# Patient Record
Sex: Female | Born: 1975
Health system: Southern US, Community
[De-identification: ages and names within clinical notes are randomized; demographics above are authoritative.]

## PROBLEM LIST (undated history)

## (undated) DIAGNOSIS — I10 Essential (primary) hypertension: Secondary | ICD-10-CM

## (undated) DIAGNOSIS — K5792 Diverticulitis of intestine, part unspecified, without perforation or abscess without bleeding: Secondary | ICD-10-CM

## (undated) DIAGNOSIS — J449 Chronic obstructive pulmonary disease, unspecified: Secondary | ICD-10-CM

## (undated) DIAGNOSIS — E119 Type 2 diabetes mellitus without complications: Secondary | ICD-10-CM

## (undated) DIAGNOSIS — I4891 Unspecified atrial fibrillation: Secondary | ICD-10-CM

## (undated) DIAGNOSIS — E669 Obesity, unspecified: Secondary | ICD-10-CM

## (undated) DIAGNOSIS — N390 Urinary tract infection, site not specified: Secondary | ICD-10-CM

## (undated) DIAGNOSIS — I509 Heart failure, unspecified: Secondary | ICD-10-CM

## (undated) HISTORY — PX: TUBAL LIGATION: SHX77

## (undated) HISTORY — PX: CARDIAC CATHETERIZATION: SHX172

## (undated) HISTORY — PX: ABDOMINAL HYSTERECTOMY: SHX81

---

## 1998-03-24 ENCOUNTER — Emergency Department (HOSPITAL_COMMUNITY): Admission: EM | Admit: 1998-03-24 | Discharge: 1998-03-24 | Payer: Self-pay | Admitting: Emergency Medicine

## 1998-04-28 ENCOUNTER — Emergency Department (HOSPITAL_COMMUNITY): Admission: EM | Admit: 1998-04-28 | Discharge: 1998-04-28 | Payer: Self-pay | Admitting: Emergency Medicine

## 1998-06-15 ENCOUNTER — Emergency Department (HOSPITAL_COMMUNITY): Admission: EM | Admit: 1998-06-15 | Discharge: 1998-06-15 | Payer: Self-pay

## 1999-07-02 ENCOUNTER — Emergency Department (HOSPITAL_COMMUNITY): Admission: EM | Admit: 1999-07-02 | Discharge: 1999-07-02 | Payer: Self-pay | Admitting: Emergency Medicine

## 1999-08-29 ENCOUNTER — Emergency Department (HOSPITAL_COMMUNITY): Admission: EM | Admit: 1999-08-29 | Discharge: 1999-08-29 | Payer: Self-pay | Admitting: Emergency Medicine

## 1999-10-13 ENCOUNTER — Emergency Department (HOSPITAL_COMMUNITY): Admission: EM | Admit: 1999-10-13 | Discharge: 1999-10-13 | Payer: Self-pay | Admitting: *Deleted

## 1999-10-27 ENCOUNTER — Emergency Department (HOSPITAL_COMMUNITY): Admission: EM | Admit: 1999-10-27 | Discharge: 1999-10-27 | Payer: Self-pay | Admitting: Emergency Medicine

## 1999-11-22 ENCOUNTER — Emergency Department (HOSPITAL_COMMUNITY): Admission: EM | Admit: 1999-11-22 | Discharge: 1999-11-22 | Payer: Self-pay | Admitting: Podiatry

## 2000-03-09 ENCOUNTER — Emergency Department (HOSPITAL_COMMUNITY): Admission: EM | Admit: 2000-03-09 | Discharge: 2000-03-09 | Payer: Self-pay | Admitting: Emergency Medicine

## 2000-03-11 ENCOUNTER — Emergency Department (HOSPITAL_COMMUNITY): Admission: EM | Admit: 2000-03-11 | Discharge: 2000-03-11 | Payer: Self-pay | Admitting: Emergency Medicine

## 2000-03-26 ENCOUNTER — Emergency Department (HOSPITAL_COMMUNITY): Admission: EM | Admit: 2000-03-26 | Discharge: 2000-03-26 | Payer: Self-pay | Admitting: Emergency Medicine

## 2000-09-11 ENCOUNTER — Emergency Department (HOSPITAL_COMMUNITY): Admission: EM | Admit: 2000-09-11 | Discharge: 2000-09-11 | Payer: Self-pay | Admitting: Emergency Medicine

## 2001-02-16 ENCOUNTER — Emergency Department (HOSPITAL_COMMUNITY): Admission: EM | Admit: 2001-02-16 | Discharge: 2001-02-16 | Payer: Self-pay | Admitting: Emergency Medicine

## 2001-02-16 ENCOUNTER — Encounter: Payer: Self-pay | Admitting: Emergency Medicine

## 2001-03-29 ENCOUNTER — Emergency Department (HOSPITAL_COMMUNITY): Admission: EM | Admit: 2001-03-29 | Discharge: 2001-03-29 | Payer: Self-pay | Admitting: Emergency Medicine

## 2001-03-30 ENCOUNTER — Emergency Department (HOSPITAL_COMMUNITY): Admission: EM | Admit: 2001-03-30 | Discharge: 2001-03-30 | Payer: Self-pay | Admitting: Emergency Medicine

## 2001-05-04 ENCOUNTER — Emergency Department (HOSPITAL_COMMUNITY): Admission: EM | Admit: 2001-05-04 | Discharge: 2001-05-04 | Payer: Self-pay | Admitting: Emergency Medicine

## 2001-05-22 ENCOUNTER — Emergency Department (HOSPITAL_COMMUNITY): Admission: EM | Admit: 2001-05-22 | Discharge: 2001-05-22 | Payer: Self-pay

## 2001-06-12 ENCOUNTER — Emergency Department (HOSPITAL_COMMUNITY): Admission: EM | Admit: 2001-06-12 | Discharge: 2001-06-12 | Payer: Self-pay

## 2001-06-18 ENCOUNTER — Encounter: Payer: Self-pay | Admitting: *Deleted

## 2001-06-18 ENCOUNTER — Emergency Department (HOSPITAL_COMMUNITY): Admission: EM | Admit: 2001-06-18 | Discharge: 2001-06-18 | Payer: Self-pay

## 2002-01-19 ENCOUNTER — Other Ambulatory Visit: Admission: RE | Admit: 2002-01-19 | Discharge: 2002-01-19 | Payer: Self-pay | Admitting: *Deleted

## 2002-02-14 ENCOUNTER — Emergency Department (HOSPITAL_COMMUNITY): Admission: EM | Admit: 2002-02-14 | Discharge: 2002-02-14 | Payer: Self-pay | Admitting: Emergency Medicine

## 2002-06-25 ENCOUNTER — Emergency Department (HOSPITAL_COMMUNITY): Admission: EM | Admit: 2002-06-25 | Discharge: 2002-06-25 | Payer: Self-pay | Admitting: Emergency Medicine

## 2002-06-25 ENCOUNTER — Encounter: Payer: Self-pay | Admitting: Emergency Medicine

## 2003-06-10 ENCOUNTER — Other Ambulatory Visit: Admission: RE | Admit: 2003-06-10 | Discharge: 2003-06-10 | Payer: Self-pay | Admitting: Obstetrics and Gynecology

## 2003-06-28 ENCOUNTER — Emergency Department (HOSPITAL_COMMUNITY): Admission: EM | Admit: 2003-06-28 | Discharge: 2003-06-28 | Payer: Self-pay | Admitting: Emergency Medicine

## 2004-11-12 ENCOUNTER — Emergency Department (HOSPITAL_COMMUNITY): Admission: EM | Admit: 2004-11-12 | Discharge: 2004-11-12 | Payer: Self-pay | Admitting: Emergency Medicine

## 2004-11-24 ENCOUNTER — Other Ambulatory Visit: Admission: RE | Admit: 2004-11-24 | Discharge: 2004-11-24 | Payer: Self-pay | Admitting: Obstetrics and Gynecology

## 2004-12-04 ENCOUNTER — Emergency Department (HOSPITAL_COMMUNITY): Admission: EM | Admit: 2004-12-04 | Discharge: 2004-12-04 | Payer: Self-pay | Admitting: Emergency Medicine

## 2004-12-04 ENCOUNTER — Emergency Department (HOSPITAL_COMMUNITY): Admission: EM | Admit: 2004-12-04 | Discharge: 2004-12-04 | Payer: Self-pay | Admitting: *Deleted

## 2004-12-31 ENCOUNTER — Emergency Department (HOSPITAL_COMMUNITY): Admission: EM | Admit: 2004-12-31 | Discharge: 2004-12-31 | Payer: Self-pay | Admitting: *Deleted

## 2005-04-12 ENCOUNTER — Emergency Department (HOSPITAL_COMMUNITY): Admission: EM | Admit: 2005-04-12 | Discharge: 2005-04-12 | Payer: Self-pay | Admitting: Emergency Medicine

## 2005-11-04 ENCOUNTER — Emergency Department (HOSPITAL_COMMUNITY): Admission: EM | Admit: 2005-11-04 | Discharge: 2005-11-04 | Payer: Self-pay | Admitting: Emergency Medicine

## 2005-12-11 ENCOUNTER — Ambulatory Visit (HOSPITAL_COMMUNITY): Admission: RE | Admit: 2005-12-11 | Discharge: 2005-12-11 | Payer: Self-pay | Admitting: Orthopedic Surgery

## 2006-02-14 ENCOUNTER — Emergency Department (HOSPITAL_COMMUNITY): Admission: EM | Admit: 2006-02-14 | Discharge: 2006-02-14 | Payer: Self-pay | Admitting: Family Medicine

## 2006-06-22 ENCOUNTER — Emergency Department (HOSPITAL_COMMUNITY): Admission: EM | Admit: 2006-06-22 | Discharge: 2006-06-22 | Payer: Self-pay | Admitting: Family Medicine

## 2006-09-18 ENCOUNTER — Emergency Department (HOSPITAL_COMMUNITY): Admission: EM | Admit: 2006-09-18 | Discharge: 2006-09-18 | Payer: Self-pay | Admitting: Emergency Medicine

## 2006-10-05 ENCOUNTER — Emergency Department (HOSPITAL_COMMUNITY): Admission: EM | Admit: 2006-10-05 | Discharge: 2006-10-05 | Payer: Self-pay | Admitting: Emergency Medicine

## 2007-07-14 ENCOUNTER — Emergency Department (HOSPITAL_COMMUNITY): Admission: EM | Admit: 2007-07-14 | Discharge: 2007-07-14 | Payer: Self-pay | Admitting: Emergency Medicine

## 2007-09-23 ENCOUNTER — Emergency Department (HOSPITAL_COMMUNITY): Admission: EM | Admit: 2007-09-23 | Discharge: 2007-09-24 | Payer: Self-pay | Admitting: Emergency Medicine

## 2007-11-24 ENCOUNTER — Emergency Department (HOSPITAL_COMMUNITY): Admission: EM | Admit: 2007-11-24 | Discharge: 2007-11-24 | Payer: Self-pay | Admitting: Emergency Medicine

## 2008-01-05 ENCOUNTER — Inpatient Hospital Stay (HOSPITAL_COMMUNITY): Admission: EM | Admit: 2008-01-05 | Discharge: 2008-01-10 | Payer: Self-pay | Admitting: Family Medicine

## 2009-12-16 ENCOUNTER — Emergency Department (HOSPITAL_BASED_OUTPATIENT_CLINIC_OR_DEPARTMENT_OTHER): Admission: EM | Admit: 2009-12-16 | Discharge: 2009-12-16 | Payer: Self-pay | Admitting: Emergency Medicine

## 2010-01-22 ENCOUNTER — Emergency Department (HOSPITAL_BASED_OUTPATIENT_CLINIC_OR_DEPARTMENT_OTHER): Admission: EM | Admit: 2010-01-22 | Discharge: 2010-01-22 | Payer: Self-pay | Admitting: Emergency Medicine

## 2010-01-31 ENCOUNTER — Emergency Department (HOSPITAL_BASED_OUTPATIENT_CLINIC_OR_DEPARTMENT_OTHER): Admission: EM | Admit: 2010-01-31 | Discharge: 2010-01-31 | Payer: Self-pay | Admitting: Emergency Medicine

## 2010-01-31 ENCOUNTER — Ambulatory Visit: Payer: Self-pay | Admitting: Diagnostic Radiology

## 2010-02-19 ENCOUNTER — Emergency Department (HOSPITAL_BASED_OUTPATIENT_CLINIC_OR_DEPARTMENT_OTHER): Admission: EM | Admit: 2010-02-19 | Discharge: 2010-02-19 | Payer: Self-pay | Admitting: Emergency Medicine

## 2010-04-10 ENCOUNTER — Emergency Department (HOSPITAL_BASED_OUTPATIENT_CLINIC_OR_DEPARTMENT_OTHER): Admission: EM | Admit: 2010-04-10 | Discharge: 2010-04-10 | Payer: Self-pay | Admitting: Emergency Medicine

## 2010-12-29 NOTE — H&P (Signed)
Sonya Pennington, Sonya Pennington               ACCOUNT NO.:  [id number]   MEDICAL RECORD NO.:  [id number]          PATIENT TYPE:  INP   LOCATION:  5506                         FACILITY:  [facility]   PHYSICIAN:  Madaline Savage, MD        DATE OF BIRTH:  [birth year]   DATE OF ADMISSION:  01/04/2008  DATE OF DISCHARGE:                              HISTORY & PHYSICAL   PRIMARY CARE PHYSICIAN:  Windle Guard, M.D.  This patient is unassigned  to [place].   CHIEF COMPLAINT:  Shortness of breath.   HISTORY OF PRESENT ILLNESS:  Sonya Pennington is a 35 year old lady with a  history of asthma in the past who comes in with complaints of shortness  of breath for the last 5 days.  She apparently went camping last weekend  and she states she inhaled some smoke at that time and since then she  has been feeling short of breath.  Her breathing has progressively  gotten worse in the last few days.  She apparently went and saw her  primary care doctor 2 days ago, who started her on steroids and  Singulair and inhalers.  Her symptoms had not improved but, in fact, it  got worse to the fact that she used her nebulizer 8 times today without  any improvement.  She also states that she had some cough with whitish  sputum production.  She also states she had felt feverish at home and  had a fever up to 102.  No other complaints.   PAST MEDICAL HISTORY:  1. History of asthma.  2. History of hypertension.  3. Gastroesophageal reflux disease.  4. Migraine headaches.  5. Obesity.   PAST SURGICAL HISTORY:  1. Cesarean section.  2. Tubal ligation.   ALLERGIES:  She is allergic to PENICILLIN, IMITREX and CODEINE.   CURRENT MEDICATIONS:  1. Excedrin.  2. Albuterol inhalers.  3. Prednisone 20 mg daily.  4. Singulair 10 mg daily.   SOCIAL HISTORY:  She smokes up to 2-1/2 packs of cigarettes a day and  she states she has been smoking since age 85.  She denies any history of  alcohol or drug abuse.   FAMILY HISTORY:  Her father is  46.  He has kidney problems.  Her mother  is 73.  She is healthy.   REVIEW OF SYSTEMS:  She denies any recent weight loss or weight gain.  She does complain of fever and mild chills.  HEENT:  No headaches, no  blurred vision.  No sore throat.  CARDIOVASCULAR:  Denies chest pain or  palpitations.  RESPIRATORY:  She does have shortness of breath and  cough.  GI:  No abdominal pain, nausea, vomiting, diarrhea or  constipation.   PHYSICAL EXAM:  She is alert and oriented x3.  VITAL SIGNS:  Temperature is 97.7, pulse rate of 102, blood pressure is  150/103, respiratory rate 24, oxygen saturation 94% on room air.  HEENT:  Head atraumatic, normocephalic.  Pupils bilaterally equal and  reactive to light.  Mucous membranes appear moist.  NECK:  Supple.  No JVD, no  carotid bruits.  CARDIOVASCULAR:  S1, S2 heard.  Regular rhythm.  CHEST:  There is decreased air entry bilaterally.  ABDOMEN:  Soft.  Bowel sounds heard.  EXTREMITIES:  No edema, cyanosis or clubbing.   LABS:  White count of 13.2, hemoglobin 14.2, platelets 270.  X-ray just  showed no acute disease.   IMPRESSION:  1. One asthma exacerbation.  2. Tobaccoism.  3. History of hypertension.  4. Obesity.   PLAN:  This is a 35 year old lady who comes in with shortness of breath  and her physical exam shows bronchospasm.  She most likely has an asthma  exacerbation.  We will start her on nebulizers and I will start her on  IV steroids.  I will also empirically start her on IV antibiotics at  this time.  I will continue her Singulair as at home and we will also  check her peak flows while she is in the hospital.  I will advise her to  quit smoking and I will put her on DVT prophylaxis.      Madaline Savage, MD  Electronically Signed     PKN/MEDQ  D:  01/05/2008  T:  01/05/2008  Job:  [id number]

## 2010-12-29 NOTE — Discharge Summary (Signed)
NAMECARMAN, AUXIER NO.:  [id number]   MEDICAL RECORD NO.:  [id number]          PATIENT TYPE:  INP   LOCATION:  5506                         FACILITY:  [facility]   PHYSICIAN:  Elliot Cousin, M.D.    DATE OF BIRTH:  07-Aug-1976   DATE OF ADMISSION:  01/04/2008  DATE OF DISCHARGE:  01/10/2008                               DISCHARGE SUMMARY   DISCHARGE DIAGNOSES:  1. Acute asthma exacerbation.  2. Tobacco abuse.  3. Leukocytosis thought to be secondary to steroids.  4. Newly diagnosed type 2 diabetes mellitus.  5. Generalized anxiety   DISCHARGE MEDICATIONS:  1. Combivent MDI 2 puffs every 4 hours as needed.  2. Albuterol nebulizer every 6 hours as needed.  3. Singulair 10 mg daily.  4. Qvar 80 mcg 1 puff every 12 hours.  5. Glipizide 5 mg daily.  6. Metformin 500 mg daily.  7. Lantus 10 units subcutaneously b.i.d.  8. Alprazolam 0.25 mg b.i.d. p.r.n.  9. Prednisone taper, take as directed.   DISCHARGE DISPOSITION:  The patient is approaching medical stability.  The plan is to discharge her to home tomorrow with close followup with  Dr. Jeannetta Nap or new physician of choice.   CONSULTATIONS:  None.   PROCEDURE PERFORMED:  1. Chest x-ray on Jan 07, 2008.  The results revealed increased left      lower lobe atelectasis.  2. Chest x-ray on  Jan 04, 2008.  The results revealed no acute      cardiopulmonary disease.   HISTORY OF PRESENT ILLNESS:  The patient is a 35 year old woman with a  past medical history significant for asthma, who presented to the  emergency department with a chief complaint of shortness of breath.  The  patient was seen by her primary care physician several days prior to the  hospital presentation and was started on steroids, Singulair, and  inhalers.  The patient did not improve.  When she presented to the  emergency department, she was afebrile and hemodynamically stable.  She  was oxygenating 94% on room air.  Her white blood cell count  was  elevated at 13.2.  Her chest x-ray revealed no acute cardiopulmonary  disease.  The patient was admitted for further evaluation and  management.   For additional details, please see the dictated History and Physical.   HOSPITAL COURSE:  #1.  ACUTE ASTHMA EXACERBATION AND TOBACCO ABUSE:  The patient was  started on treatment with intravenous steroids, albuterol and Atrovent  nebulizers, Singulair, and Avelox empirically.  Supplemental oxygen was  provided to keep her oxygen saturations greater than 90%.  The patient  was advised to stop smoking indefinitely.  A nicotine patch was placed.  Official tobacco cessation counseling was ordered.  For additional  symptomatic treatment, the patient was started on Mucinex and  Robitussin.  Over the first 2 days of the hospitalization, the patient's  symptoms slowly subsided.  A followup chest x-ray revealed increased  atelectasis at the left base.  Over the past 24-36 hours, the patient  has become less symptomatic.  Solu-Medrol was tapered off, and she  was  started on prednisone 60 mg daily two days ago.  The albuterol and  Atrovent nebulizers have been discontinued.  She has been started on  Combivent MDI today.  Peak flows were evaluated by the respiratory  therapist during the hospital course.  Yesterday, her peak flows were  ranging from 200 prior to nebulizer treatments to 280 after nebulizer  treatments.   The patient will continue on treatment with Combivent and a prednisone  taper.  She was advised to continue medications that her primary care  physician started including Singulair, Qvar, and albuterol nebulizer as  needed.  The patient was started on a nicotine patch during the  hospitalization.  She was advised to continue the nicotine patch as  directed.   #2.  LEUKOCYTOSIS:  The patient's white blood cell count was 13.2 at the  time of the initial hospital assessment.  With steroid therapy, her  white blood cell count  reached a high of 22.4 yesterday.  The patient  has been completely afebrile during the hospitalization.  The  leukocytosis was felt to be secondary to steroid therapy.  She was,  however, started empirically on Avelox.  As of today, she has completed  4 days of Avelox therapy.  Given that there are no obvious signs of  infection, the Avelox will be discontinued at the time of hospital  discharge.   #3.  NEWLY DIAGNOSED TYPE 2 DIABETES MELLITUS:  The patient's venous  glucose was well over 300 during the evaluation in the emergency  department.  She was given steroids by her primary care physician and  during her stay in the emergency department.  A hemoglobin A1c was  ordered, and it was elevated at 7.0.  The patient appears to have newly  diagnosed diabetes mellitus, although steroid-induced hyperglycemia is  also a consideration.  Nevertheless, the patient was started on sliding  scale NovoLog and titrating doses of Lantus insulin.  Metformin and  glipizide were started subsequently.  The registered dietician/diabetes  educator was consulted to provide the patient with education.  The  nursing staff also instructed the patient on proper glucose monitoring  techniques and self insulin injections.  The patient appears to have  fair to good understanding.  When she is discharged, the sliding scale NovoLog will be discontinued.  She will be maintained on glipizide and metformin as well as Lantus.  As  the prednisone is tapered off, the extent of the need for Lantus and/or  the oral hypoglycemic agents will more than likely be decreased  The  patient was advised to not take glipizide if her capillary blood glucose  fell below 120.  The Lantus will be titrated down to 10 units  subcutaneously q.12 h as her venous glucose has improved over the past  24 hours.  This will be done to avoid symptomatic hypoglycemia.   #4.  GENERALIZED ANXIETY:  The patient was somewhat anxious during the   hospitalization.  She was, therefore, started on treatment with as-  needed alprazolam.  The alprazolam appears to have helped.   #5.  LOOSE STOOLS:  Over the past 24 hours, the patient has had multiple  loose stools.  She attributes the loose stools to improving her diet in  the hospital by eating more vegetables and fruits This will be monitored  closely.      Elliot Cousin, M.D.  Electronically Signed     DF/MEDQ  D:  01/09/2008  T:  01/09/2008  Job:  [id number]

## 2011-05-12 LAB — DIFFERENTIAL
Basophils Absolute: 0
Basophils Relative: 0
Lymphocytes Relative: 14
Lymphs Abs: 1.9
Neutrophils Relative %: 82 — ABNORMAL HIGH

## 2011-05-12 LAB — CULTURE, BLOOD (ROUTINE X 2): Culture: NO GROWTH

## 2011-05-12 LAB — COMPREHENSIVE METABOLIC PANEL
ALT: 39 — ABNORMAL HIGH
AST: 24
AST: 24
Alkaline Phosphatase: 55
Alkaline Phosphatase: 62
BUN: 10
CO2: 26
Chloride: 102
Chloride: 97
Creatinine, Ser: 0.81
GFR calc Af Amer: >60
GFR calc non Af Amer: >60
GFR calc non Af Amer: >60
Glucose, Bld: 293 — ABNORMAL HIGH
Potassium: 4.3
Total Bilirubin: 0.5
Total Protein: 6.1

## 2011-05-12 LAB — CBC
HCT: 44.9
Hemoglobin: 13.6
Hemoglobin: 13.9
Hemoglobin: 14.2
MCHC: 33.6
MCHC: 34.6
MCV: 89.5
MCV: 89.6
Platelets: 270
RBC: 4.53
RDW: 12.9
RDW: 13.4
RDW: 13.4
RDW: 13.4
WBC: 19.6 — ABNORMAL HIGH

## 2011-05-12 LAB — BASIC METABOLIC PANEL
BUN: 14
CO2: 23
CO2: 33 — ABNORMAL HIGH
Chloride: 100
Creatinine, Ser: 0.87
GFR calc Af Amer: >60
Glucose, Bld: 90
Sodium: 135
Sodium: 141

## 2011-05-12 LAB — GLUCOSE, RANDOM: Glucose, Bld: 385 — ABNORMAL HIGH

## 2011-11-24 ENCOUNTER — Encounter (HOSPITAL_BASED_OUTPATIENT_CLINIC_OR_DEPARTMENT_OTHER): Payer: Self-pay | Admitting: *Deleted

## 2011-11-24 ENCOUNTER — Emergency Department (HOSPITAL_BASED_OUTPATIENT_CLINIC_OR_DEPARTMENT_OTHER)
Admission: EM | Admit: 2011-11-24 | Discharge: 2011-11-24 | Disposition: A | Discharge status: Home / Self Care | Payer: Medicaid Other | Attending: Emergency Medicine | Admitting: Emergency Medicine

## 2011-11-24 DIAGNOSIS — J40 Bronchitis, not specified as acute or chronic: Secondary | ICD-10-CM

## 2011-11-24 DIAGNOSIS — J449 Chronic obstructive pulmonary disease, unspecified: Secondary | ICD-10-CM | POA: Insufficient documentation

## 2011-11-24 DIAGNOSIS — J4489 Other specified chronic obstructive pulmonary disease: Secondary | ICD-10-CM | POA: Insufficient documentation

## 2011-11-24 DIAGNOSIS — R059 Cough, unspecified: Secondary | ICD-10-CM | POA: Insufficient documentation

## 2011-11-24 DIAGNOSIS — R05 Cough: Secondary | ICD-10-CM | POA: Insufficient documentation

## 2011-11-24 DIAGNOSIS — F17200 Nicotine dependence, unspecified, uncomplicated: Secondary | ICD-10-CM | POA: Insufficient documentation

## 2011-11-24 DIAGNOSIS — J069 Acute upper respiratory infection, unspecified: Secondary | ICD-10-CM | POA: Insufficient documentation

## 2011-11-24 DIAGNOSIS — J441 Chronic obstructive pulmonary disease with (acute) exacerbation: Secondary | ICD-10-CM

## 2011-11-24 HISTORY — DX: Chronic obstructive pulmonary disease, unspecified: J44.9

## 2011-11-24 MED ORDER — PREDNISONE 50 MG PO TABS
ORAL_TABLET | ORAL | Status: AC
  Filled 2011-11-24: qty 1

## 2011-11-24 MED ORDER — PREDNISONE 50 MG PO TABS: 50.0000 mg | ORAL_TABLET | Freq: Every day | ORAL | Status: AC

## 2011-11-24 MED ORDER — ALBUTEROL SULFATE (2.5 MG/3ML) 0.083% IN NEBU: 2.5000 mg | INHALATION_SOLUTION | Freq: Four times a day (QID) | RESPIRATORY_TRACT | Status: DC | PRN

## 2011-11-24 MED ORDER — IPRATROPIUM BROMIDE 0.02 % IN SOLN
0.5000 mg | Freq: Once | RESPIRATORY_TRACT | Status: AC
  Administered 2011-11-24: 0.5 mg via RESPIRATORY_TRACT

## 2011-11-24 MED ORDER — ALBUTEROL SULFATE (5 MG/ML) 0.5% IN NEBU
5.0000 mg | INHALATION_SOLUTION | Freq: Once | RESPIRATORY_TRACT | Status: AC
  Administered 2011-11-24: 5 mg via RESPIRATORY_TRACT

## 2011-11-24 MED ORDER — BENZONATATE 100 MG PO CAPS: 100.0000 mg | ORAL_CAPSULE | Freq: Three times a day (TID) | ORAL | Status: AC

## 2011-11-24 MED ORDER — BENZONATATE 100 MG PO CAPS
100.0000 mg | ORAL_CAPSULE | Freq: Once | ORAL | Status: AC
  Administered 2011-11-24: 100 mg via ORAL
  Filled 2011-11-24: qty 1

## 2011-11-24 MED ORDER — ALBUTEROL SULFATE (5 MG/ML) 0.5% IN NEBU
INHALATION_SOLUTION | RESPIRATORY_TRACT | Status: AC
  Filled 2011-11-24: qty 1

## 2011-11-24 MED ORDER — PREDNISONE 50 MG PO TABS
60.0000 mg | ORAL_TABLET | Freq: Once | ORAL | Status: AC
  Administered 2011-11-24: 60 mg via ORAL

## 2011-11-24 MED ORDER — IPRATROPIUM BROMIDE 0.02 % IN SOLN
RESPIRATORY_TRACT | Status: AC
  Filled 2011-11-24: qty 2.5

## 2011-11-24 MED ORDER — PREDNISONE 10 MG PO TABS
ORAL_TABLET | ORAL | Status: AC
  Filled 2011-11-24: qty 1

## 2011-11-24 NOTE — ED Notes (Signed)
Pt c/o URI symptoms x 1 week 

## 2011-11-24 NOTE — ED Provider Notes (Signed)
History     CSN: [id number]  Arrival date & time 11/24/11  1319   First MD Initiated Contact with Patient 11/24/11 1343      Chief Complaint  Patient presents with  . URI    (Consider location/radiation/quality/duration/timing/severity/associated sxs/prior treatment) HPI Comments: Patient presents with one week of cold and cough symptoms.  She notes that she had some subjective fevers this week.  Her cough is at times productive.  She's noted some increased wheezing for which she's been using her albuterol inhaler.  She does smoke but has been less able to do so this week due to her shortness of breath and coughing.  No chest pain.  She does have a sick contact in that a family member had similar symptoms recently  Patient is a 36 y.o. female presenting with URI. The history is provided by the patient. No language interpreter was used.  URI The primary symptoms include fever, fatigue, sore throat, cough and wheezing. Primary symptoms do not include headaches, abdominal pain, nausea, vomiting, arthralgias or rash. The current episode started 6 to 7 days ago. This is a new problem. The problem has not changed since onset. The illness is not associated with chills.    Past Medical History  Diagnosis Date  . COPD (chronic obstructive pulmonary disease)   . Asthma     Past Surgical History  Procedure Date  . Cesarean section   . Tubal ligation   . Abdominal hysterectomy     History reviewed. No pertinent family history.  History  Substance Use Topics  . Smoking status: Current Everyday Smoker -- 1.0 packs/day  . Smokeless tobacco: Not on file  . Alcohol Use: No    OB History    Grav Para Term Preterm Abortions TAB SAB Ect Mult Living                  Review of Systems  Constitutional: Positive for fever and fatigue. Negative for chills.  HENT: Positive for sore throat.   Eyes: Negative.  Negative for discharge and redness.  Respiratory: Positive for cough and  wheezing. Negative for shortness of breath.   Cardiovascular: Negative.  Negative for chest pain.  Gastrointestinal: Negative.  Negative for nausea, vomiting, abdominal pain and diarrhea.  Genitourinary: Negative.  Negative for dysuria and vaginal discharge.  Musculoskeletal: Negative.  Negative for back pain and arthralgias.  Skin: Negative.  Negative for color change and rash.  Neurological: Negative.  Negative for syncope and headaches.  Hematological: Negative.  Negative for adenopathy.  Psychiatric/Behavioral: Negative.  Negative for confusion.  All other systems reviewed and are negative.    Allergies  Penicillins  Home Medications  No current outpatient prescriptions on file.  BP 107/69  Pulse 100  Temp(Src) 98.8 F (37.1 C) (Oral)  Resp 18  Ht 5\' 4"  (1.626 m)  Wt 194 lb (87.998 kg)  BMI 33.30 kg/m2  SpO2 100%  Physical Exam  Nursing note and vitals reviewed. Constitutional: She is oriented to person, place, and time. She appears well-developed and well-nourished.  Non-toxic appearance. She does not have a sickly appearance.  HENT:  Head: Normocephalic and atraumatic.  Mouth/Throat: Oropharynx is clear and moist. No oropharyngeal exudate.  Eyes: Conjunctivae, EOM and lids are normal. Pupils are equal, round, and reactive to light. No scleral icterus.  Neck: Trachea normal and normal range of motion. Neck supple.  Cardiovascular: Normal rate, regular rhythm and normal heart sounds.  Exam reveals no gallop and no friction rub.  No murmur heard. Pulmonary/Chest: Effort normal. No respiratory distress. She has wheezes. She has no rales. She exhibits no tenderness.       Mild expiratory wheezing but good air movement bilaterally  Abdominal: Soft. Normal appearance. There is no tenderness. There is no rebound, no guarding and no CVA tenderness.  Musculoskeletal: Normal range of motion.  Neurological: She is alert and oriented to person, place, and time. She has normal  strength.  Skin: Skin is warm, dry and intact. No rash noted.  Psychiatric: She has a normal mood and affect. Her behavior is normal. Judgment and thought content normal.    ED Course  Procedures (including critical care time)  Labs Reviewed - No data to display No results found.   No diagnosis found.    MDM  Patient with likely bronchitis that is viral in origin.  I will prescribe her cough medicine to assist her with those symptoms.  I've given her a nebulizer treatment here she had some mild expiratory wheezing but I feel she would be safe for discharge home.  I will place her on any short course of steroids and refill her albuterol nebulizer solution.  I've counseled her regarding stopping smoking as well.        Nat Christen, MD 11/24/11 [phone]

## 2011-11-24 NOTE — Discharge Instructions (Signed)
Bronchitis  Bronchitis is the body's way of reacting to injury and/or infection (inflammation) of the bronchi. Bronchi are the air tubes that extend from the windpipe into the lungs. If the inflammation becomes severe, it may cause shortness of breath.  CAUSES   Inflammation may be caused by:   A virus.   Germs (bacteria).   Dust.   Allergens.   Pollutants and many other irritants.  The cells lining the bronchial tree are covered with tiny hairs (cilia). These constantly beat upward, away from the lungs, toward the mouth. This keeps the lungs free of pollutants. When these cells become too irritated and are unable to do their job, mucus begins to develop. This causes the characteristic cough of bronchitis. The cough clears the lungs when the cilia are unable to do their job. Without either of these protective mechanisms, the mucus would settle in the lungs. Then you would develop pneumonia.  Smoking is a common cause of bronchitis and can contribute to pneumonia. Stopping this habit is the single most important thing you can do to help yourself.  TREATMENT    Your caregiver may prescribe an antibiotic if the cough is caused by bacteria. Also, medicines that open up your airways make it easier to breathe. Your caregiver may also recommend or prescribe an expectorant. It will loosen the mucus to be coughed up. Only take over-the-counter or prescription medicines for pain, discomfort, or fever as directed by your caregiver.   Removing whatever causes the problem (smoking, for example) is critical to preventing the problem from getting worse.   Cough suppressants may be prescribed for relief of cough symptoms.   Inhaled medicines may be prescribed to help with symptoms now and to help prevent problems from returning.   For those with recurrent (chronic) bronchitis, there may be a need for steroid medicines.  SEEK IMMEDIATE MEDICAL CARE IF:    During treatment, you develop more pus-like mucus (purulent  sputum).   You have a fever.   Your baby is older than 3 months with a rectal temperature of 102 F (38.9 C) or higher.   Your baby is 3 months old or younger with a rectal temperature of 100.4 F (38 C) or higher.   You become progressively more ill.   You have increased difficulty breathing, wheezing, or shortness of breath.  It is necessary to seek immediate medical care if you are elderly or sick from any other disease.  MAKE SURE YOU:    Understand these instructions.   Will watch your condition.   Will get help right away if you are not doing well or get worse.  Document Released: 08/02/2005 Document Revised: 07/22/2011 Document Reviewed: 06/11/2008  [employer] Patient Information 2012 [employer].      Chronic Obstructive Pulmonary Disease  Chronic obstructive pulmonary disease (COPD) is a condition in which airflow from the lungs is restricted. The lungs can never return to normal, but there are measures you can take which will improve them and make you feel better.  CAUSES    Smoking.   Exposure to secondhand smoke.   Breathing in irritants (pollution, cigarette smoke, strong smells, aerosol sprays, paint fumes).   History of lung infections.  TREATMENT   Treatment focuses on making you comfortable (supportive care). Your caregiver may prescribe medications (inhaled or pills) to help improve your breathing.  HOME CARE INSTRUCTIONS    If you smoke, stop smoking.   Avoid exposure to smoke, chemicals, and fumes that aggravate your breathing.     elimination of secretions (antihistamines and cough syrups). This decreases respiratory capacity and may lead to infections.   Drink enough water and fluids to keep your urine clear or pale yellow. This loosens secretions.   Use humidifiers at home and at your bedside if they do not make breathing difficult.    Receive all protective vaccines your caregiver suggests, especially pneumococcal and influenza.   Use home oxygen as suggested.   Stay active. Exercise and physical activity will help maintain your ability to do things you want to do.   Eat a healthy diet.  SEEK MEDICAL CARE IF:   You develop pus-like mucus (sputum).   Breathing is more labored or exercise becomes difficult to do.   You are running out of the medicine you take for your breathing.  SEEK IMMEDIATE MEDICAL CARE IF:   You have a rapid heart rate.   You have agitation, confusion, tremors, or are in a stupor (family members may need to observe this).   It becomes difficult to breathe.   You develop chest pain.   You have a fever.  MAKE SURE YOU:   Understand these instructions.   Will watch your condition.   Will get help right away if you are not doing well or get worse.  Document Released: 05/12/2005 Document Revised: 07/22/2011 Document Reviewed: 10/02/2010 [facility] Patient Information 2012 [place], [place].Smoking Cessation This document explains the best ways for you to quit smoking and new treatments to help. It lists new medicines that can double or triple your chances of quitting and quitting for good. It also considers ways to avoid relapses and concerns you may have about quitting, including weight gain. NICOTINE: A POWERFUL ADDICTION If you have tried to quit smoking, you know how hard it can be. It is hard because nicotine is a very addictive drug. For some people, it can be as addictive as heroin or cocaine. Usually, people make 2 or 3 tries, or more, before finally being able to quit. Each time you try to quit, you can learn about what helps and what hurts. Quitting takes hard work and a lot of effort, but you can quit smoking. QUITTING SMOKING IS ONE OF THE MOST IMPORTANT THINGS YOU WILL EVER DO.  You will live longer, feel better, and live better.   The impact on your body of quitting smoking is  felt almost immediately:   Within 20 minutes, blood pressure decreases. Pulse returns to its normal level.   After 8 hours, carbon monoxide levels in the blood return to normal. Oxygen level increases.   After 24 hours, chance of heart attack starts to decrease. Breath, hair, and body stop smelling like smoke.   After 48 hours, damaged nerve endings begin to recover. Sense of taste and smell improve.   After 72 hours, the body is virtually free of nicotine. Bronchial tubes relax and breathing becomes easier.   After 2 to 12 weeks, lungs can hold more air. Exercise becomes easier and circulation improves.   Quitting will reduce your risk of having a heart attack, stroke, cancer, or lung disease:   After 1 year, the risk of coronary heart disease is cut in half.   After 5 years, the risk of stroke falls to the same as a nonsmoker.   After 10 years, the risk of lung cancer is cut in half and the risk of other cancers decreases significantly.   After 15 years, the risk of coronary heart disease drops, usually to the level of  a nonsmoker.   If you are pregnant, quitting smoking will improve your chances of having a healthy baby.   The people you live with, especially your children, will be healthier.   You will have extra money to spend on things other than cigarettes.  FIVE KEYS TO QUITTING Studies have shown that these 5 steps will help you quit smoking and quit for good. You have the best chances of quitting if you use them together: 1. Get ready.  2. Get support and encouragement.  3. Learn new skills and behaviors.  4. Get medicine to reduce your nicotine addiction and use it correctly.  5. Be prepared for relapse or difficult situations. Be determined to continue trying to quit, even if you do not succeed at first.  1. GET READY  Set a quit date.   Change your environment.   Get rid of ALL cigarettes, ashtrays, matches, and lighters in your home, car, and place of work.    Do not let people smoke in your home.   Review your past attempts to quit. Think about what worked and what did not.   Once you quit, do not smoke. NOT EVEN A PUFF!  2. GET SUPPORT AND ENCOURAGEMENT Studies have shown that you have a better chance of being successful if you have help. You can get support in many ways.  Tell your family, friends, and coworkers that you are going to quit and need their support. Ask them not to smoke around you.   Talk to your caregivers (doctor, dentist, nurse, pharmacist, psychologist, and/or smoking counselor).   Get individual, group, or telephone counseling and support. The more counseling you have, the better your chances are of quitting. Programs are available at [employer] and health centers. Call your local health department for information about programs in your area.   Spiritual beliefs and practices may help some smokers quit.   Quit meters are Photographer that keep track of quit statistics, such as amount of "quit-time," cigarettes not smoked, and money saved.   Many smokers find one or more of the many self-help books available useful in helping them quit and stay off tobacco.  3. LEARN NEW SKILLS AND BEHAVIORS  Try to distract yourself from urges to smoke. Talk to someone, go for a walk, or occupy your time with a task.   When you first try to quit, change your routine. Take a different route to work. Drink tea instead of coffee. Eat breakfast in a different place.   Do something to reduce your stress. Take a hot bath, exercise, or read a book.   Plan something enjoyable to do every day. Reward yourself for not smoking.   Explore interactive web-based programs that specialize in helping you quit.  4. GET MEDICINE AND USE IT CORRECTLY Medicines can help you stop smoking and decrease the urge to smoke. Combining medicine with the above behavioral methods and support can quadruple your chances of  successfully quitting smoking. The [place] Food and Drug Administration (FDA) has approved 7 medicines to help you quit smoking. These medicines fall into 3 categories.  Nicotine replacement therapy (delivers nicotine to your body without the negative effects and risks of smoking):   Nicotine gum: Available over-the-counter.   Nicotine lozenges: Available over-the-counter.   Nicotine inhaler: Available by prescription.   Nicotine nasal spray: Available by prescription.   Nicotine skin patches (transdermal): Available by prescription and over-the-counter.   Antidepressant medicine (helps people abstain from  smoking, but how this works is unknown):   Bupropion sustained-release (SR) tablets: Available by prescription.   Nicotinic receptor partial agonist (simulates the effect of nicotine in your brain):   Varenicline tartrate tablets: Available by prescription.   Ask your caregiver for advice about which medicines to use and how to use them. Carefully read the information on the package.   Everyone who is trying to quit may benefit from using a medicine. If you are pregnant or trying to become pregnant, nursing an infant, you are under age 44, or you smoke fewer than 10 cigarettes per day, talk to your caregiver before taking any nicotine replacement medicines.   You should stop using a nicotine replacement product and call your caregiver if you experience nausea, dizziness, weakness, vomiting, fast or irregular heartbeat, mouth problems with the lozenge or gum, or redness or swelling of the skin around the patch that does not go away.   Do not use any other product containing nicotine while using a nicotine replacement product.   Talk to your caregiver before using these products if you have diabetes, heart disease, asthma, stomach ulcers, you had a recent heart attack, you have high blood pressure that is not controlled with medicine, a history of irregular heartbeat, or you have been  prescribed medicine to help you quit smoking.  5. BE PREPARED FOR RELAPSE OR DIFFICULT SITUATIONS  Most relapses occur within the first 3 months after quitting. Do not be discouraged if you start smoking again. Remember, most people try several times before they finally quit.   You may have symptoms of withdrawal because your body is used to nicotine. You may crave cigarettes, be irritable, feel very hungry, cough often, get headaches, or have difficulty concentrating.   The withdrawal symptoms are only temporary. They are strongest when you first quit, but they will go away within 10 to 14 days.  Here are some difficult situations to watch for:  Alcohol. Avoid drinking alcohol. Drinking lowers your chances of successfully quitting.   Caffeine. Try to reduce the amount of caffeine you consume. It also lowers your chances of successfully quitting.   Other smokers. Being around smoking can make you want to smoke. Avoid smokers.   Weight gain. Many smokers will gain weight when they quit, usually less than 10 pounds. Eat a healthy diet and stay active. Do not let weight gain distract you from your main goal, quitting smoking. Some medicines that help you quit smoking may also help delay weight gain. You can always lose the weight gained after you quit.   Bad mood or depression. There are a lot of ways to improve your mood other than smoking.  If you are having problems with any of these situations, talk to your caregiver. SPECIAL SITUATIONS AND CONDITIONS Studies suggest that everyone can quit smoking. Your situation or condition can give you a special reason to quit.  Pregnant women/new mothers: By quitting, you protect your baby's health and your own.   Hospitalized patients: By quitting, you reduce health problems and help healing.   Heart attack patients: By quitting, you reduce your risk of a second heart attack.   Lung, head, and neck cancer patients: By quitting, you reduce your  chance of a second cancer.   Parents of children and adolescents: By quitting, you protect your children from illnesses caused by secondhand smoke.  QUESTIONS TO THINK ABOUT Think about the following questions before you try to stop smoking. You may want to talk about  your answers with your caregiver.  Why do you want to quit?   If you tried to quit in the past, what helped and what did not?   What will be the most difficult situations for you after you quit? How will you plan to handle them?   Who can help you through the tough times? Your family? Friends? Caregiver?   What pleasures do you get from smoking? What ways can you still get pleasure if you quit?  Here are some questions to ask your caregiver:  How can you help me to be successful at quitting?   What medicine do you think would be best for me and how should I take it?   What should I do if I need more help?   What is smoking withdrawal like? How can I get information on withdrawal?  Quitting takes hard work and a lot of effort, but you can quit smoking. FOR MORE INFORMATION  Smokefree.gov (http://www.davis-sullivan.com/) provides free, accurate, evidence-based information and professional assistance to help support the immediate and long-term needs of people trying to quit smoking. Document Released: 07/27/2001 Document Revised: 07/22/2011 Document Reviewed: 05/19/2009 [facility] Patient Information 2012 [place], [place].

## 2012-03-07 ENCOUNTER — Emergency Department (HOSPITAL_BASED_OUTPATIENT_CLINIC_OR_DEPARTMENT_OTHER): Payer: Medicaid Other

## 2012-03-07 ENCOUNTER — Emergency Department (HOSPITAL_BASED_OUTPATIENT_CLINIC_OR_DEPARTMENT_OTHER)
Admission: EM | Admit: 2012-03-07 | Discharge: 2012-03-07 | Disposition: A | Discharge status: Other Acute Inpatient Hospital | Payer: Medicaid Other | Attending: Emergency Medicine | Admitting: Emergency Medicine

## 2012-03-07 ENCOUNTER — Encounter (HOSPITAL_BASED_OUTPATIENT_CLINIC_OR_DEPARTMENT_OTHER): Payer: Self-pay | Admitting: *Deleted

## 2012-03-07 DIAGNOSIS — I10 Essential (primary) hypertension: Secondary | ICD-10-CM | POA: Insufficient documentation

## 2012-03-07 DIAGNOSIS — R000 Tachycardia, unspecified: Secondary | ICD-10-CM | POA: Insufficient documentation

## 2012-03-07 DIAGNOSIS — I509 Heart failure, unspecified: Secondary | ICD-10-CM | POA: Insufficient documentation

## 2012-03-07 DIAGNOSIS — R079 Chest pain, unspecified: Secondary | ICD-10-CM | POA: Insufficient documentation

## 2012-03-07 DIAGNOSIS — N390 Urinary tract infection, site not specified: Secondary | ICD-10-CM | POA: Insufficient documentation

## 2012-03-07 DIAGNOSIS — R509 Fever, unspecified: Secondary | ICD-10-CM | POA: Insufficient documentation

## 2012-03-07 DIAGNOSIS — M7989 Other specified soft tissue disorders: Secondary | ICD-10-CM | POA: Insufficient documentation

## 2012-03-07 DIAGNOSIS — E119 Type 2 diabetes mellitus without complications: Secondary | ICD-10-CM | POA: Insufficient documentation

## 2012-03-07 DIAGNOSIS — R0602 Shortness of breath: Secondary | ICD-10-CM | POA: Insufficient documentation

## 2012-03-07 DIAGNOSIS — R109 Unspecified abdominal pain: Secondary | ICD-10-CM | POA: Insufficient documentation

## 2012-03-07 HISTORY — DX: Type 2 diabetes mellitus without complications: E11.9

## 2012-03-07 HISTORY — DX: Essential (primary) hypertension: I10

## 2012-03-07 HISTORY — DX: Obesity, unspecified: E66.9

## 2012-03-07 LAB — URINE MICROSCOPIC-ADD ON

## 2012-03-07 LAB — COMPREHENSIVE METABOLIC PANEL
ALT: 36 U/L — ABNORMAL HIGH (ref 0–35)
AST: 32 U/L (ref 0–37)
Albumin: 3.1 g/dL — ABNORMAL LOW (ref 3.5–5.2)
Alkaline Phosphatase: 51 U/L (ref 39–117)
Calcium: 9.3 mg/dL (ref 8.4–10.5)
GFR calc Af Amer: >90 mL/min (ref >90)
Glucose, Bld: 143 mg/dL — ABNORMAL HIGH (ref 70–99)
Potassium: 4.1 mEq/L (ref 3.5–5.1)
Sodium: 135 mEq/L (ref 135–145)
Total Protein: 5.9 g/dL — ABNORMAL LOW (ref 6.0–8.3)

## 2012-03-07 LAB — CBC WITH DIFFERENTIAL/PLATELET
Basophils Absolute: 0 10*3/uL (ref 0.0–0.1)
Basophils Relative: 0 % (ref 0–1)
Eosinophils Absolute: 1.7 10*3/uL — ABNORMAL HIGH (ref 0.0–0.7)
Hemoglobin: 12.3 g/dL (ref 12.0–15.0)
MCH: 30 pg (ref 26.0–34.0)
MCHC: 34.5 g/dL (ref 30.0–36.0)
Monocytes Absolute: 0.8 10*3/uL (ref 0.1–1.0)
Monocytes Relative: 6 % (ref 3–12)
Neutro Abs: 6.6 10*3/uL (ref 1.7–7.7)
Neutrophils Relative %: 49 % (ref 43–77)
RDW: 13.7 % (ref 11.5–15.5)

## 2012-03-07 LAB — URINALYSIS, ROUTINE W REFLEX MICROSCOPIC
Bilirubin Urine: NEGATIVE
Glucose, UA: NEGATIVE mg/dL
Specific Gravity, Urine: 1.019 (ref 1.005–1.030)
Urobilinogen, UA: 1 mg/dL (ref 0.0–1.0)
pH: 6.5 (ref 5.0–8.0)

## 2012-03-07 LAB — TROPONIN I
Troponin I: <0.3 ng/mL (ref <0.30)
Troponin I: <0.3 ng/mL (ref <0.30)

## 2012-03-07 LAB — PREGNANCY, URINE: Preg Test, Ur: NEGATIVE

## 2012-03-07 LAB — PRO B NATRIURETIC PEPTIDE: Pro B Natriuretic peptide (BNP): 5492 pg/mL — ABNORMAL HIGH (ref 0–125)

## 2012-03-07 MED ORDER — IOHEXOL 350 MG/ML SOLN
80.0000 mL | Freq: Once | INTRAVENOUS | Status: AC | PRN
  Administered 2012-03-07: 80 mL via INTRAVENOUS

## 2012-03-07 MED ORDER — NITROFURANTOIN MONOHYD MACRO 100 MG PO CAPS
100.0000 mg | ORAL_CAPSULE | ORAL | Status: AC
  Administered 2012-03-07: 100 mg via ORAL
  Filled 2012-03-07: qty 1

## 2012-03-07 MED ORDER — ALBUTEROL SULFATE (5 MG/ML) 0.5% IN NEBU
5.0000 mg | INHALATION_SOLUTION | Freq: Once | RESPIRATORY_TRACT | Status: AC
  Administered 2012-03-07: 5 mg via RESPIRATORY_TRACT

## 2012-03-07 MED ORDER — IPRATROPIUM BROMIDE 0.02 % IN SOLN
RESPIRATORY_TRACT | Status: AC
  Administered 2012-03-07: 0.5 mg via RESPIRATORY_TRACT
  Filled 2012-03-07: qty 2.5

## 2012-03-07 MED ORDER — GI COCKTAIL ~~LOC~~
30.0000 mL | Freq: Once | ORAL | Status: AC
  Administered 2012-03-07: 30 mL via ORAL
  Filled 2012-03-07: qty 30

## 2012-03-07 MED ORDER — ALBUTEROL SULFATE (5 MG/ML) 0.5% IN NEBU
INHALATION_SOLUTION | RESPIRATORY_TRACT | Status: AC
  Administered 2012-03-07: 5 mg via RESPIRATORY_TRACT
  Filled 2012-03-07: qty 1

## 2012-03-07 MED ORDER — IPRATROPIUM BROMIDE 0.02 % IN SOLN
0.5000 mg | Freq: Once | RESPIRATORY_TRACT | Status: AC
  Administered 2012-03-07: 0.5 mg via RESPIRATORY_TRACT

## 2012-03-07 MED ORDER — FUROSEMIDE 10 MG/ML IJ SOLN
40.0000 mg | Freq: Once | INTRAMUSCULAR | Status: AC
  Administered 2012-03-07: 40 mg via INTRAVENOUS
  Filled 2012-03-07: qty 4

## 2012-03-07 MED ORDER — SODIUM CHLORIDE 0.9 % IV BOLUS (SEPSIS)
1000.0000 mL | Freq: Once | INTRAVENOUS | Status: DC
  Administered 2012-03-07: 1000 mL via INTRAVENOUS

## 2012-03-07 MED ORDER — MORPHINE SULFATE 2 MG/ML IJ SOLN
2.0000 mg | Freq: Once | INTRAMUSCULAR | Status: AC
  Administered 2012-03-07: 2 mg via INTRAVENOUS
  Filled 2012-03-07: qty 1

## 2012-03-07 MED ORDER — PREDNISONE 50 MG PO TABS: 60.0000 mg | ORAL_TABLET | Freq: Once | ORAL | Status: DC

## 2012-03-07 NOTE — ED Provider Notes (Signed)
History     CSN: [id number]  Arrival date & time 03/07/12  1454   First MD Initiated Contact with Patient 03/07/12 1513      Chief Complaint  Patient presents with  . Shortness of Breath    (Consider location/radiation/quality/duration/timing/severity/associated sxs/prior treatment) HPI  36 year old F with COPD who presents with shortness of breath for 3 weeks. The sensation feels like she is drowning. It is mildly relieved by albuterol. She does not take any other medications. It is worsened with physical activity, even going across the room is difficult. She previously was very physically active as she lost  Approximately 120 pounds over the past few years. She report postural dyspnea for one day and is constantly tired. She has experienced orthopnea for years and prefers to be sitting upright. She also has swelling in her calves and feet. It is associated with cramping. She has a yellow productive yellow cough.    Past Medical History  Diagnosis Date  . COPD (chronic obstructive pulmonary disease)   . Asthma   . Diabetes type 2, controlled     significant weight loss  . Obesity     lost 120 pounds with diet and exercise   . Hypertension     improved after weight loss    Past Surgical History  Procedure Date  . Cesarean section   . Tubal ligation   . Abdominal hysterectomy     History reviewed. No pertinent family history.  History  Substance Use Topics  . Smoking status: Current Everyday Smoker -- 1.0 packs/day  . Smokeless tobacco: Not on file  . Alcohol Use: No    OB History    Grav Para Term Preterm Abortions TAB SAB Ect Mult Living                  Review of Systems  Constitutional: Positive for fever and fatigue.  HENT: Negative for neck stiffness.   Respiratory: Positive for choking, chest tightness, shortness of breath and wheezing.   Cardiovascular: Positive for chest pain.  Gastrointestinal:       Abdominal pain  Genitourinary: Positive for  dyspareunia. Negative for vaginal bleeding, vaginal discharge and vaginal pain.  Psychiatric/Behavioral:       Anxiety     Allergies  Penicillins and Codeine  Home Medications   Current Outpatient Rx  Name Route Sig Dispense Refill  . ALBUTEROL SULFATE HFA 108 (90 BASE) MCG/ACT IN AERS Inhalation Inhale 2 puffs into the lungs every 6 (six) hours as needed. For shortness of breath    . ALBUTEROL SULFATE (2.5 MG/3ML) 0.083% IN NEBU Nebulization Take 2.5 mg by nebulization every 6 (six) hours as needed.    . ASPIRIN-ACETAMINOPHEN-CAFFEINE 250-250-65 MG PO TABS Oral Take 2 tablets by mouth every 6 (six) hours as needed. For migraine    . OVER THE COUNTER MEDICATION Oral Take 30 mLs by mouth daily as needed. For cough/cold symptoms   Cough/cold/severe allergy liquid      BP 119/82  Pulse 112  Temp 98 F (36.7 C) (Oral)  Resp 20  SpO2 100%  Physical Exam  Vitals reviewed. Constitutional: She is oriented to person, place, and time. She appears well-developed and well-nourished.  Non-toxic appearance. She appears distressed.  HENT:  Head: Normocephalic and atraumatic.  Eyes: Pupils are equal, round, and reactive to light. Right conjunctiva is injected.  Neck: Normal range of motion. No hepatojugular reflux and no JVD present. No mass present.  Cardiovascular: Regular rhythm.  Tachycardia  present.        Muffled heart sounds  Pulmonary/Chest: Effort normal. No respiratory distress. She has no decreased breath sounds. She has no wheezes. She has no rhonchi. She has rales in the right lower field.  Abdominal: There is generalized tenderness. There is no rigidity, no guarding and no CVA tenderness.  Musculoskeletal:       Right foot: She exhibits swelling.       Left foot: She exhibits swelling.  Lymphadenopathy:       Head (right side): No submental and no submandibular adenopathy present.       Head (left side): No submental and no submandibular adenopathy present.  Neurological: She  is alert and oriented to person, place, and time.  Skin: She is diaphoretic.    ED Course  Procedures (including critical care time)  The patient refused a pelvic exam even though she has a history of PID, because she has had the same partner for 4 years and is not concerned about infection being the cause of her dyspareunia.   Labs Reviewed  COMPREHENSIVE METABOLIC PANEL - Abnormal; Notable for the following:    Glucose, Bld 143 (*)     Total Protein 5.9 (*)     Albumin 3.1 (*)     ALT 36 (*)     All other components within normal limits  URINALYSIS, ROUTINE W REFLEX MICROSCOPIC - Abnormal; Notable for the following:    APPearance CLOUDY (*)     Hgb urine dipstick TRACE (*)     Nitrite POSITIVE (*)     Leukocytes, UA SMALL (*)     All other components within normal limits  URINE MICROSCOPIC-ADD ON - Abnormal; Notable for the following:    Squamous Epithelial / LPF FEW (*)     Bacteria, UA MANY (*)     All other components within normal limits  CBC WITH DIFFERENTIAL - Abnormal; Notable for the following:    WBC 13.3 (*)     HCT 35.7 (*)     Lymphs Abs 4.3 (*)     Eosinophils Relative 13 (*)     Eosinophils Absolute 1.7 (*)     All other components within normal limits  D-DIMER, QUANTITATIVE - Abnormal; Notable for the following:    D-Dimer, Quant 1.06 (*)     All other components within normal limits  PRO B NATRIURETIC PEPTIDE - Abnormal; Notable for the following:    Pro B Natriuretic peptide (BNP) 5492.0 (*)     All other components within normal limits  LIPASE, BLOOD  PREGNANCY, URINE  TROPONIN I  TROPONIN I   Dg Chest 2 View  03/07/2012  *RADIOLOGY REPORT*  Clinical Data: COPD, asthma, abdominal pain, smoker, cough, shortness of breath  CHEST - 2 VIEW  Comparison: 01/31/2010  Findings: Mild cardiac enlargement since 01/31/2010.  Stable vascularity without CHF.  Slight nonspecific basilar interstitial prominence.  No definite pneumonia, collapse, consolidation,  effusion, pneumothorax.  Trachea midline.  IMPRESSION: Cardiomegaly with vascular and basilar interstitial prominence.  No definite CHF or pneumonia.  Original Report Authenticated By: Judie Petit. Ruel Favors, M.D.   Ct Angio Chest W/cm &/or Wo Cm  03/07/2012  *RADIOLOGY REPORT*  Clinical Data: Shortness of breath.  Elevated D-dimer.  Evaluate for pulmonary embolism.  CT ANGIOGRAPHY CHEST  Technique:  Multidetector CT imaging of the chest using the standard protocol during bolus administration of intravenous contrast. Multiplanar reconstructed images including MIPs were obtained and reviewed to evaluate the vascular anatomy.  Contrast:  80mL OMNIPAQUE IOHEXOL 350 MG/ML SOLN  Comparison: No priors.  Findings:  Mediastinum: There are no filling defects within the pulmonary arterial tree to suggest underlying pulmonary embolism. Heart size is mildly enlarged. There is no significant pericardial fluid, thickening or pericardial calcification.  Notably, on this non gated CT examination and there is a paucity of cardiac motion, and the left ventricle appears dilated (measures up to 68 mm in diameter), suggesting underlying left ventricular dysfunction.  No coronary artery calcifications are present.  No atherosclerosis in the thoracic aorta.  Lungs/Pleura: Small right-sided pleural effusion layering dependently.  Patchy areas of very mild ground glass attenuation with some associated interlobular septal thickening, favored to reflect mild pulmonary edema.  There are several more nodular appearing areas of ground-glass attenuation which could simply represent developing alveolar edema, or could be indicative of multifocal infectious or inflammatory process.  Upper Abdomen: Unremarkable.  Musculoskeletal: There are no aggressive appearing lytic or blastic lesions noted in the visualized portions of the skeleton.  IMPRESSION: 1.  No evidence of pulmonary embolism. 2.  However, the appearance of the left ventricle is highly  unusual.  Specifically, the left ventricle appears dilated, and there is very little cardiac motion on this non gated CT examination which suggests left ventricular dysfunction.  Although it is impossible to accurately measure on an examination such as this, I would estimate left ventricular ejection fraction to be significantly low (potentially less than 30%).  Clinical correlation is highly recommended with consideration for further evaluation with echocardiography if clinically indicated.  Given the patient's young age and the lack of coronary artery calcification, clinical correlation for signs and symptoms of myocarditis or other systemic disorder such as sarcoidosis with myocardial involvement is recommended. 3.  Numerous borderline enlarged mediastinal and bilateral hilar lymph nodes are nonspecific and may be reactive in the setting of infection.  Alternatively, this could be indicative of underlying systemic disc disease such as sarcoidosis. 4.  The appearance of the lungs is consistent with interstitial edema, likely with early development of alveolar edema. Superimposed multifocal infection is difficult to entirely exclude, but is not strongly favored.  These results were called by telephone on 03/07/2012 at 05:30 p.m. to Dr. Alto Denver, who verbally acknowledged these results.  Original Report Authenticated By: Florencia Reasons, M.D.    Date: 03/07/2012 @ 15:39  Rate: 105  Rhythm: sinus tachycardia  QRS Axis: right  Intervals: normal  ST/T Wave abnormalities: t wave inversion II, III, aVF, V6  Conduction Disutrbances:none  Narrative Interpretation: concern for inferior ischemia based on T wave inversion, poor R wave progression for in lateral leads  Old EKG Reviewed: none available    Date: 03/07/2012 @ 19:12  Rate: 102  Rhythm: sinus tachycardia  QRS Axis: right  Intervals: normal  ST/T Wave abnormalities: t wave inversion II, III, aVF, V6  Conduction Disutrbances:none  Narrative  Interpretation: concern for inferior ischemia based on T wave inversion, poor R wave progression for in lateral leads  Old EKG Reviewed: unchanged    1. Heart failure   2. Urinary tract infection     MDM  36 year old female with COPD who presented with shortness of breath of 3 weeks duration. While she had COPD from a 25 pack year history of smoking, her story was concerning for a cardiac cause of her dyspnea and exercise intolerance. She was given a duoneb with minimal relief of her symptoms and decreased her tachycardia. However, upon walking less than 20 yards on a  cardiopulmonary monitor, her heart rate increase to 179 beats a minute and oxygen saturation went to 91%. This resolved with rest. Given her elevated pro-BNP and finding of dilated LV on chest CT, the greatest concern for the cause of her symptoms is a left sided heart failure. The patient denied any history of known CAD or CHF. She was given Lasix 40 mg IV, which induced significant diuresis. Her chest pain was persistent until she received 2 mg of morphine. A repeat ECG and Troponin were negative for AMI. The chest pain resolved and the dyspnea improved. However, the patient's status required transfer to an inpatient setting for further evaluation and management of her CHF. The patient prefer [facility] for it's proximity to her home, so arrangements were made for transfer. Her other issue was a urinary tract infection noted on screening urinalysis. Given her anaphylactic reactions to penicillins, she was treated with one dose of nitrofurantoin instead of a cephalosporin. This should be continued at the hospital.         Garnetta Buddy, MD 03/07/12 [phone]

## 2012-03-07 NOTE — ED Notes (Signed)
Noted 2+ edema in feet

## 2012-03-07 NOTE — ED Notes (Signed)
Sob x 3 weeks. Feet and ankles are swollen. Left sided chest pain when she lays down. No energy.

## 2012-03-08 NOTE — ED Provider Notes (Signed)
I saw and evaluated the patient, reviewed the resident's note and I agree with the findings and plan.  Evaristo Tsuda, MD 03/08/12 1218 

## 2012-11-26 ENCOUNTER — Emergency Department (HOSPITAL_BASED_OUTPATIENT_CLINIC_OR_DEPARTMENT_OTHER)
Admission: EM | Admit: 2012-11-26 | Discharge: 2012-11-26 | Disposition: A | Discharge status: Home / Self Care | Payer: Medicaid Other | Attending: Emergency Medicine | Admitting: Emergency Medicine

## 2012-11-26 ENCOUNTER — Emergency Department (HOSPITAL_BASED_OUTPATIENT_CLINIC_OR_DEPARTMENT_OTHER): Payer: Medicaid Other

## 2012-11-26 ENCOUNTER — Encounter (HOSPITAL_BASED_OUTPATIENT_CLINIC_OR_DEPARTMENT_OTHER): Payer: Self-pay | Admitting: *Deleted

## 2012-11-26 DIAGNOSIS — E669 Obesity, unspecified: Secondary | ICD-10-CM | POA: Insufficient documentation

## 2012-11-26 DIAGNOSIS — I509 Heart failure, unspecified: Secondary | ICD-10-CM | POA: Insufficient documentation

## 2012-11-26 DIAGNOSIS — J449 Chronic obstructive pulmonary disease, unspecified: Secondary | ICD-10-CM | POA: Insufficient documentation

## 2012-11-26 DIAGNOSIS — K5900 Constipation, unspecified: Secondary | ICD-10-CM | POA: Insufficient documentation

## 2012-11-26 DIAGNOSIS — R109 Unspecified abdominal pain: Secondary | ICD-10-CM | POA: Insufficient documentation

## 2012-11-26 DIAGNOSIS — E119 Type 2 diabetes mellitus without complications: Secondary | ICD-10-CM | POA: Insufficient documentation

## 2012-11-26 DIAGNOSIS — F17200 Nicotine dependence, unspecified, uncomplicated: Secondary | ICD-10-CM | POA: Insufficient documentation

## 2012-11-26 DIAGNOSIS — Z90710 Acquired absence of both cervix and uterus: Secondary | ICD-10-CM | POA: Insufficient documentation

## 2012-11-26 DIAGNOSIS — Z9851 Tubal ligation status: Secondary | ICD-10-CM | POA: Insufficient documentation

## 2012-11-26 DIAGNOSIS — N390 Urinary tract infection, site not specified: Secondary | ICD-10-CM | POA: Insufficient documentation

## 2012-11-26 DIAGNOSIS — Z79899 Other long term (current) drug therapy: Secondary | ICD-10-CM | POA: Insufficient documentation

## 2012-11-26 DIAGNOSIS — Z7982 Long term (current) use of aspirin: Secondary | ICD-10-CM | POA: Insufficient documentation

## 2012-11-26 DIAGNOSIS — J4489 Other specified chronic obstructive pulmonary disease: Secondary | ICD-10-CM | POA: Insufficient documentation

## 2012-11-26 DIAGNOSIS — I10 Essential (primary) hypertension: Secondary | ICD-10-CM | POA: Insufficient documentation

## 2012-11-26 HISTORY — DX: Urinary tract infection, site not specified: N39.0

## 2012-11-26 HISTORY — DX: Heart failure, unspecified: I50.9

## 2012-11-26 LAB — URINALYSIS, ROUTINE W REFLEX MICROSCOPIC
Nitrite: NEGATIVE
Specific Gravity, Urine: 1.014 (ref 1.005–1.030)
Urobilinogen, UA: 0.2 mg/dL (ref 0.0–1.0)
pH: 6.5 (ref 5.0–8.0)

## 2012-11-26 LAB — URINE MICROSCOPIC-ADD ON

## 2012-11-26 MED ORDER — POLYETHYLENE GLYCOL 3350 17 G PO PACK: 17.0000 g | PACK | Freq: Every day | ORAL | Status: DC

## 2012-11-26 MED ORDER — NITROFURANTOIN MONOHYD MACRO 100 MG PO CAPS: 100.0000 mg | ORAL_CAPSULE | Freq: Two times a day (BID) | ORAL | Status: DC

## 2012-11-26 NOTE — ED Notes (Signed)
Dysuria x 2 days. Hx frequent UTI's.

## 2012-11-26 NOTE — ED Provider Notes (Signed)
History     CSN: [id number]  Arrival date & time 11/26/12  1641   First MD Initiated Contact with Patient 11/26/12 1810      Chief Complaint  Patient presents with  . Abdominal Pain    (Consider location/radiation/quality/duration/timing/severity/associated sxs/prior treatment) Patient is a 37 y.o. female presenting with dysuria. The history is provided by the patient. No language interpreter was used.  Dysuria  This is a new problem. The current episode started 2 days ago. The problem occurs every urination. The problem has been gradually worsening. The pain is at a severity of 5/10. The pain is moderate. There has been no fever. She is not sexually active. Pertinent negatives include no chills. She has tried nothing for the symptoms. Her past medical history is significant for recurrent UTIs.   Pt reports she frequently gets uti's.  Pt reports she feels like she has one.  Pt also complains of constipation.  No relief with a stool softner Past Medical History  Diagnosis Date  . COPD (chronic obstructive pulmonary disease)   . Asthma   . Diabetes type 2, controlled     significant weight loss  . Obesity     lost 120 pounds with diet and exercise   . Hypertension     improved after weight loss  . UTI (urinary tract infection)   . CHF (congestive heart failure)     Past Surgical History  Procedure Laterality Date  . Cesarean section    . Tubal ligation    . Abdominal hysterectomy      History reviewed. No pertinent family history.  History  Substance Use Topics  . Smoking status: Current Every Day Smoker -- 1.00 packs/day  . Smokeless tobacco: Not on file  . Alcohol Use: No    OB History   Grav Para Term Preterm Abortions TAB SAB Ect Mult Living                  Review of Systems  Constitutional: Negative for chills.  Gastrointestinal: Positive for constipation.  Genitourinary: Positive for dysuria.  All other systems reviewed and are  negative.    Allergies  Penicillins and Codeine  Home Medications   Current Outpatient Rx  Name  Route  Sig  Dispense  Refill  . aspirin 81 MG tablet   Oral   Take 81 mg by mouth daily.         . furosemide (LASIX) 40 MG tablet   Oral   Take 40 mg by mouth daily.         Marland Kitchen lisinopril (PRINIVIL,ZESTRIL) 5 MG tablet   Oral   Take 5 mg by mouth daily.         . metoprolol tartrate (LOPRESSOR) 25 MG tablet   Oral   Take 25 mg by mouth 2 (two) times daily.         . Rivaroxaban (XARELTO) 15 MG TABS tablet   Oral   Take 15 mg by mouth daily.         Marland Kitchen spironolactone (ALDACTONE) 25 MG tablet   Oral   Take 25 mg by mouth daily.         . sucralfate (CARAFATE) 1 G tablet   Oral   Take 1 g by mouth 3 (three) times daily.         Marland Kitchen albuterol (PROVENTIL HFA;VENTOLIN HFA) 108 (90 BASE) MCG/ACT inhaler   Inhalation   Inhale 2 puffs into the lungs every 6 (six) hours as  needed. For shortness of breath         . EXPIRED: albuterol (PROVENTIL) (2.5 MG/3ML) 0.083% nebulizer solution   Nebulization   Take 2.5 mg by nebulization every 6 (six) hours as needed.         Marland Kitchen aspirin-acetaminophen-caffeine (EXCEDRIN MIGRAINE) 250-250-65 MG per tablet   Oral   Take 2 tablets by mouth every 6 (six) hours as needed. For migraine         . OVER THE COUNTER MEDICATION   Oral   Take 30 mLs by mouth daily as needed. For cough/cold symptoms   Cough/cold/severe allergy liquid           BP 106/70  Pulse 100  Temp(Src) 98 F (36.7 C) (Oral)  Resp 18  Ht 5\' 4"  (1.626 m)  Wt 167 lb 11.2 oz (76.068 kg)  BMI 28.77 kg/m2  SpO2 99%  Physical Exam  Nursing note and vitals reviewed. Constitutional: She is oriented to person, place, and time. She appears well-developed.  HENT:  Head: Normocephalic and atraumatic.  Eyes: Conjunctivae are normal. Pupils are equal, round, and reactive to light.  Neck: Normal range of motion. Neck supple.  Cardiovascular: Normal rate and  normal heart sounds.   Pulmonary/Chest: Effort normal and breath sounds normal.  Abdominal: Soft.  Musculoskeletal: Normal range of motion.  Neurological: She is alert and oriented to person, place, and time. She has normal reflexes.  Skin: Skin is warm.  Psychiatric: She has a normal mood and affect.    ED Course  Procedures (including critical care time)  Labs Reviewed  URINALYSIS, ROUTINE W REFLEX MICROSCOPIC - Abnormal; Notable for the following:    Hgb urine dipstick MODERATE (*)    Protein, ur 100 (*)    Leukocytes, UA SMALL (*)    All other components within normal limits  URINE MICROSCOPIC-ADD ON - Abnormal; Notable for the following:    Squamous Epithelial / LPF FEW (*)    Bacteria, UA MANY (*)    All other components within normal limits  URINE CULTURE   No results found.   1. UTI (lower urinary tract infection)   2. Constipation       MDM   Results for orders placed during the hospital encounter of 11/26/12  URINALYSIS, ROUTINE W REFLEX MICROSCOPIC      Result Value Range   Color, Urine YELLOW  YELLOW   APPearance CLEAR  CLEAR   Specific Gravity, Urine 1.014  1.005 - 1.030   pH 6.5  5.0 - 8.0   Glucose, UA NEGATIVE  NEGATIVE mg/dL   Hgb urine dipstick MODERATE (*) NEGATIVE   Bilirubin Urine NEGATIVE  NEGATIVE   Ketones, ur NEGATIVE  NEGATIVE mg/dL   Protein, ur 161 (*) NEGATIVE mg/dL   Urobilinogen, UA 0.2  0.0 - 1.0 mg/dL   Nitrite NEGATIVE  NEGATIVE   Leukocytes, UA SMALL (*) NEGATIVE  URINE MICROSCOPIC-ADD ON      Result Value Range   Squamous Epithelial / LPF FEW (*) RARE   WBC, UA 11-20  <3 WBC/hpf   RBC / HPF 11-20  <3 RBC/hpf   Bacteria, UA MANY (*) RARE   Dg Abd 1 View  11/26/2012  *RADIOLOGY REPORT*  Clinical Data: Lower abdominal pain.  ABDOMEN - 1 VIEW  Comparison: None.  Findings: The bowel gas pattern is unremarkable.  No abnormal abdominal calcification or focal bony abnormality.  IMPRESSION: Negative exam.   Original Report  Authenticated By: Holley Dexter, M.D.  Pt given rx for macrobid for uti.  Pt advised to follow up with her primary.   Pt given miralax for constipation.         Lonia Skinner [place], PA-C 11/27/12 [phone]

## 2012-11-28 LAB — URINE CULTURE

## 2012-11-29 ENCOUNTER — Telehealth (HOSPITAL_COMMUNITY): Payer: Self-pay | Admitting: Emergency Medicine

## 2012-11-29 NOTE — ED Provider Notes (Signed)
Medical screening examination/treatment/procedure(s) were performed by non-physician practitioner and as supervising physician I was immediately available for consultation/collaboration.  Ethelda Chick, MD 11/29/12 [phone]

## 2012-12-01 ENCOUNTER — Telehealth (HOSPITAL_COMMUNITY): Payer: Self-pay | Admitting: Emergency Medicine

## 2012-12-01 NOTE — ED Notes (Signed)
Spoke with patient , informed of lab results. Chart reviewed by A. Harris PA. She ordered Cipro 250mg  one po every 12 hours for 5 days. No refills. This was called to [facility] [phone].

## 2012-12-20 ENCOUNTER — Encounter (HOSPITAL_BASED_OUTPATIENT_CLINIC_OR_DEPARTMENT_OTHER): Payer: Self-pay | Admitting: *Deleted

## 2012-12-20 ENCOUNTER — Emergency Department (HOSPITAL_BASED_OUTPATIENT_CLINIC_OR_DEPARTMENT_OTHER)
Admission: EM | Admit: 2012-12-20 | Discharge: 2012-12-20 | Disposition: A | Discharge status: Home / Self Care | Payer: Medicaid Other | Attending: Emergency Medicine | Admitting: Emergency Medicine

## 2012-12-20 DIAGNOSIS — Z880 Allergy status to penicillin: Secondary | ICD-10-CM | POA: Insufficient documentation

## 2012-12-20 DIAGNOSIS — Y939 Activity, unspecified: Secondary | ICD-10-CM | POA: Insufficient documentation

## 2012-12-20 DIAGNOSIS — IMO0002 Reserved for concepts with insufficient information to code with codable children: Secondary | ICD-10-CM | POA: Insufficient documentation

## 2012-12-20 DIAGNOSIS — X58XXXA Exposure to other specified factors, initial encounter: Secondary | ICD-10-CM | POA: Insufficient documentation

## 2012-12-20 DIAGNOSIS — J4489 Other specified chronic obstructive pulmonary disease: Secondary | ICD-10-CM | POA: Insufficient documentation

## 2012-12-20 DIAGNOSIS — M542 Cervicalgia: Secondary | ICD-10-CM | POA: Insufficient documentation

## 2012-12-20 DIAGNOSIS — I509 Heart failure, unspecified: Secondary | ICD-10-CM | POA: Insufficient documentation

## 2012-12-20 DIAGNOSIS — Z7982 Long term (current) use of aspirin: Secondary | ICD-10-CM | POA: Insufficient documentation

## 2012-12-20 DIAGNOSIS — F17200 Nicotine dependence, unspecified, uncomplicated: Secondary | ICD-10-CM | POA: Insufficient documentation

## 2012-12-20 DIAGNOSIS — Z7901 Long term (current) use of anticoagulants: Secondary | ICD-10-CM | POA: Insufficient documentation

## 2012-12-20 DIAGNOSIS — I10 Essential (primary) hypertension: Secondary | ICD-10-CM | POA: Insufficient documentation

## 2012-12-20 DIAGNOSIS — Y929 Unspecified place or not applicable: Secondary | ICD-10-CM | POA: Insufficient documentation

## 2012-12-20 DIAGNOSIS — T148XXA Other injury of unspecified body region, initial encounter: Secondary | ICD-10-CM

## 2012-12-20 DIAGNOSIS — E119 Type 2 diabetes mellitus without complications: Secondary | ICD-10-CM | POA: Insufficient documentation

## 2012-12-20 DIAGNOSIS — E669 Obesity, unspecified: Secondary | ICD-10-CM | POA: Insufficient documentation

## 2012-12-20 DIAGNOSIS — Z87440 Personal history of urinary (tract) infections: Secondary | ICD-10-CM | POA: Insufficient documentation

## 2012-12-20 DIAGNOSIS — J449 Chronic obstructive pulmonary disease, unspecified: Secondary | ICD-10-CM | POA: Insufficient documentation

## 2012-12-20 DIAGNOSIS — Z79899 Other long term (current) drug therapy: Secondary | ICD-10-CM | POA: Insufficient documentation

## 2012-12-20 MED ORDER — MORPHINE SULFATE 4 MG/ML IJ SOLN
6.0000 mg | Freq: Once | INTRAMUSCULAR | Status: AC
  Administered 2012-12-20: 6 mg via INTRAMUSCULAR
  Filled 2012-12-20: qty 2

## 2012-12-20 MED ORDER — OXYCODONE-ACETAMINOPHEN 5-325 MG PO TABS: 2.0000 | ORAL_TABLET | ORAL | Status: DC | PRN

## 2012-12-20 MED ORDER — DIAZEPAM 5 MG PO TABS
5.0000 mg | ORAL_TABLET | Freq: Once | ORAL | Status: AC
  Administered 2012-12-20: 5 mg via ORAL
  Filled 2012-12-20: qty 1

## 2012-12-20 MED ORDER — METHOCARBAMOL 500 MG PO TABS: 500.0000 mg | ORAL_TABLET | Freq: Two times a day (BID) | ORAL | Status: DC

## 2012-12-20 NOTE — ED Notes (Signed)
Right shoulder pain x 5 days. Swelling to her left foot.

## 2012-12-20 NOTE — ED Provider Notes (Signed)
History     CSN: [id number]  Arrival date & time 12/20/12  0028   First MD Initiated Contact with Patient 12/20/12 0133      Chief Complaint  Patient presents with  . Shoulder Pain    (Consider location/radiation/quality/duration/timing/severity/associated sxs/prior treatment) Patient is a 37 y.o. female presenting with shoulder pain. The history is provided by the patient.  Shoulder Pain   patient has a right-sided shoulder and neck pain x5 days. Denies any injury recently. Pain characterized as sharp and worse with movement. No shortness of breath or anginal type pain with this. No fever or cough. No prior history of same. No medications taken prior to arrival.  Past Medical History  Diagnosis Date  . COPD (chronic obstructive pulmonary disease)   . Asthma   . Diabetes type 2, controlled     significant weight loss  . Obesity     lost 120 pounds with diet and exercise   . Hypertension     improved after weight loss  . UTI (urinary tract infection)   . CHF (congestive heart failure)     Past Surgical History  Procedure Laterality Date  . Cesarean section    . Tubal ligation    . Abdominal hysterectomy      No family history on file.  History  Substance Use Topics  . Smoking status: Current Every Day Smoker -- 1.00 packs/day  . Smokeless tobacco: Not on file  . Alcohol Use: No    OB History   Grav Para Term Preterm Abortions TAB SAB Ect Mult Living                  Review of Systems  All other systems reviewed and are negative.    Allergies  Penicillins and Codeine  Home Medications   Current Outpatient Rx  Name  Route  Sig  Dispense  Refill  . metolazone (ZAROXOLYN) 2.5 MG tablet   Oral   Take 2.5 mg by mouth daily.         . potassium chloride (K-DUR) 10 MEQ tablet   Oral   Take 20 mEq by mouth 2 (two) times daily.         Marland Kitchen albuterol (PROVENTIL HFA;VENTOLIN HFA) 108 (90 BASE) MCG/ACT inhaler   Inhalation   Inhale 2 puffs into the  lungs every 6 (six) hours as needed. For shortness of breath         . EXPIRED: albuterol (PROVENTIL) (2.5 MG/3ML) 0.083% nebulizer solution   Nebulization   Take 2.5 mg by nebulization every 6 (six) hours as needed.         Marland Kitchen aspirin 81 MG tablet   Oral   Take 81 mg by mouth daily.         Marland Kitchen aspirin-acetaminophen-caffeine (EXCEDRIN MIGRAINE) 250-250-65 MG per tablet   Oral   Take 2 tablets by mouth every 6 (six) hours as needed. For migraine         . furosemide (LASIX) 40 MG tablet   Oral   Take 40 mg by mouth daily.         Marland Kitchen lisinopril (PRINIVIL,ZESTRIL) 5 MG tablet   Oral   Take 5 mg by mouth daily.         . metoprolol tartrate (LOPRESSOR) 25 MG tablet   Oral   Take 25 mg by mouth 2 (two) times daily.         . nitrofurantoin, macrocrystal-monohydrate, (MACROBID) 100 MG capsule   Oral  Take 1 capsule (100 mg total) by mouth 2 (two) times daily.   10 capsule   0   . OVER THE COUNTER MEDICATION   Oral   Take 30 mLs by mouth daily as needed. For cough/cold symptoms   Cough/cold/severe allergy liquid         . polyethylene glycol (MIRALAX) packet   Oral   Take 17 g by mouth daily.   14 each   0   . Rivaroxaban (XARELTO) 15 MG TABS tablet   Oral   Take 15 mg by mouth daily.         Marland Kitchen spironolactone (ALDACTONE) 25 MG tablet   Oral   Take 25 mg by mouth daily.         . sucralfate (CARAFATE) 1 G tablet   Oral   Take 1 g by mouth 3 (three) times daily.           BP 107/79  Pulse 111  Temp(Src) 98.5 F (36.9 C) (Oral)  Resp 22  Wt 167 lb (75.751 kg)  BMI 28.65 kg/m2  SpO2 99%  Physical Exam  Nursing note and vitals reviewed. Constitutional: She is oriented to person, place, and time. She appears well-developed and well-nourished.  Non-toxic appearance. No distress.  HENT:  Head: Normocephalic and atraumatic.  Eyes: Conjunctivae, EOM and lids are normal. Pupils are equal, round, and reactive to light.  Neck: Normal range of  motion. Neck supple. No tracheal deviation present. No mass present.    Cardiovascular: Regular rhythm and normal heart sounds.  Tachycardia present.  Exam reveals no gallop.   No murmur heard. Pulmonary/Chest: Effort normal and breath sounds normal. No stridor. No respiratory distress. She has no decreased breath sounds. She has no wheezes. She has no rhonchi. She has no rales.  Abdominal: Soft. Normal appearance and bowel sounds are normal. She exhibits no distension. There is no tenderness. There is no rebound and no CVA tenderness.  Musculoskeletal: Normal range of motion. She exhibits no edema and no tenderness.  Neurological: She is alert and oriented to person, place, and time. She has normal strength. No cranial nerve deficit or sensory deficit. GCS eye subscore is 4. GCS verbal subscore is 5. GCS motor subscore is 6.  Skin: Skin is warm and dry. No abrasion and no rash noted.  Psychiatric: She has a normal mood and affect. Her speech is normal and behavior is normal.    ED Course  Procedures (including critical care time)  Labs Reviewed - No data to display No results found.   No diagnosis found.    MDM  Pt given pain meds and feels better--stable for d/c        Toy Baker, MD 12/20/12 0301

## 2013-02-01 ENCOUNTER — Emergency Department (HOSPITAL_BASED_OUTPATIENT_CLINIC_OR_DEPARTMENT_OTHER): Payer: Medicaid Other

## 2013-02-01 ENCOUNTER — Emergency Department (HOSPITAL_BASED_OUTPATIENT_CLINIC_OR_DEPARTMENT_OTHER)
Admission: EM | Admit: 2013-02-01 | Discharge: 2013-02-01 | Disposition: A | Discharge status: Home / Self Care | Payer: Medicaid Other | Attending: Emergency Medicine | Admitting: Emergency Medicine

## 2013-02-01 ENCOUNTER — Encounter (HOSPITAL_BASED_OUTPATIENT_CLINIC_OR_DEPARTMENT_OTHER): Payer: Self-pay | Admitting: Student

## 2013-02-01 DIAGNOSIS — S8002XA Contusion of left knee, initial encounter: Secondary | ICD-10-CM

## 2013-02-01 DIAGNOSIS — S20219A Contusion of unspecified front wall of thorax, initial encounter: Secondary | ICD-10-CM | POA: Insufficient documentation

## 2013-02-01 DIAGNOSIS — N390 Urinary tract infection, site not specified: Secondary | ICD-10-CM | POA: Insufficient documentation

## 2013-02-01 DIAGNOSIS — S40011A Contusion of right shoulder, initial encounter: Secondary | ICD-10-CM

## 2013-02-01 DIAGNOSIS — R296 Repeated falls: Secondary | ICD-10-CM | POA: Insufficient documentation

## 2013-02-01 DIAGNOSIS — E669 Obesity, unspecified: Secondary | ICD-10-CM | POA: Insufficient documentation

## 2013-02-01 DIAGNOSIS — S93409A Sprain of unspecified ligament of unspecified ankle, initial encounter: Secondary | ICD-10-CM | POA: Insufficient documentation

## 2013-02-01 DIAGNOSIS — S8000XA Contusion of unspecified knee, initial encounter: Secondary | ICD-10-CM | POA: Insufficient documentation

## 2013-02-01 DIAGNOSIS — J449 Chronic obstructive pulmonary disease, unspecified: Secondary | ICD-10-CM | POA: Insufficient documentation

## 2013-02-01 DIAGNOSIS — I509 Heart failure, unspecified: Secondary | ICD-10-CM | POA: Insufficient documentation

## 2013-02-01 DIAGNOSIS — S93402A Sprain of unspecified ligament of left ankle, initial encounter: Secondary | ICD-10-CM

## 2013-02-01 DIAGNOSIS — S40019A Contusion of unspecified shoulder, initial encounter: Secondary | ICD-10-CM | POA: Insufficient documentation

## 2013-02-01 DIAGNOSIS — Z79899 Other long term (current) drug therapy: Secondary | ICD-10-CM | POA: Insufficient documentation

## 2013-02-01 DIAGNOSIS — Y9389 Activity, other specified: Secondary | ICD-10-CM | POA: Insufficient documentation

## 2013-02-01 DIAGNOSIS — R110 Nausea: Secondary | ICD-10-CM | POA: Insufficient documentation

## 2013-02-01 DIAGNOSIS — S20211A Contusion of right front wall of thorax, initial encounter: Secondary | ICD-10-CM

## 2013-02-01 DIAGNOSIS — Y9289 Other specified places as the place of occurrence of the external cause: Secondary | ICD-10-CM | POA: Insufficient documentation

## 2013-02-01 DIAGNOSIS — J4489 Other specified chronic obstructive pulmonary disease: Secondary | ICD-10-CM | POA: Insufficient documentation

## 2013-02-01 DIAGNOSIS — S0990XA Unspecified injury of head, initial encounter: Secondary | ICD-10-CM

## 2013-02-01 DIAGNOSIS — J45909 Unspecified asthma, uncomplicated: Secondary | ICD-10-CM | POA: Insufficient documentation

## 2013-02-01 DIAGNOSIS — Z7982 Long term (current) use of aspirin: Secondary | ICD-10-CM | POA: Insufficient documentation

## 2013-02-01 DIAGNOSIS — Z7901 Long term (current) use of anticoagulants: Secondary | ICD-10-CM | POA: Insufficient documentation

## 2013-02-01 DIAGNOSIS — I10 Essential (primary) hypertension: Secondary | ICD-10-CM | POA: Insufficient documentation

## 2013-02-01 DIAGNOSIS — Z880 Allergy status to penicillin: Secondary | ICD-10-CM | POA: Insufficient documentation

## 2013-02-01 DIAGNOSIS — E119 Type 2 diabetes mellitus without complications: Secondary | ICD-10-CM | POA: Insufficient documentation

## 2013-02-01 DIAGNOSIS — R42 Dizziness and giddiness: Secondary | ICD-10-CM | POA: Insufficient documentation

## 2013-02-01 MED ORDER — TRAMADOL HCL 50 MG PO TABS: 50.0000 mg | ORAL_TABLET | Freq: Four times a day (QID) | ORAL | Status: DC | PRN

## 2013-02-01 MED ORDER — ONDANSETRON 8 MG PO TBDP
8.0000 mg | ORAL_TABLET | Freq: Once | ORAL | Status: AC
  Administered 2013-02-01: 8 mg via ORAL
  Filled 2013-02-01: qty 1

## 2013-02-01 MED ORDER — HYDROMORPHONE HCL PF 1 MG/ML IJ SOLN
1.0000 mg | Freq: Once | INTRAMUSCULAR | Status: AC
  Administered 2013-02-01: 1 mg via INTRAMUSCULAR
  Filled 2013-02-01: qty 1

## 2013-02-01 MED ORDER — PROMETHAZINE HCL 25 MG PO TABS: 25.0000 mg | ORAL_TABLET | Freq: Four times a day (QID) | ORAL | Status: DC | PRN

## 2013-02-01 NOTE — ED Notes (Signed)
Pt taking po items well, given meal tray. Pt unable to secure ride to home for 2 hrs. Verbalized understanding of discharge and able to wait in lobby for ride without difficulty.

## 2013-02-01 NOTE — Discharge Instructions (Signed)
 Blunt Chest Trauma Blunt chest trauma is an injury caused by a blow to the chest. These chest injuries can be very painful. Blunt chest trauma often results in bruised or broken (fractured) ribs. Most cases of bruised and fractured ribs from blunt chest traumas get better after 1 to 3 weeks of rest and pain medicine. Often, the soft tissue in the chest wall is also injured, causing pain and bruising. Internal organs, such as the heart and lungs, may also be injured. Blunt chest trauma can lead to serious medical problems. This injury requires immediate medical care. CAUSES   Motor vehicle collisions.  Falls.  Physical violence.  Sports injuries. SYMPTOMS   Chest pain. The pain may be worse when you move or breathe deeply.  Shortness of breath.  Lightheadedness.  Bruising.  Tenderness.  Swelling. DIAGNOSIS  Your caregiver will do a physical exam. X-rays may be taken to look for fractures. However, minor rib fractures may not show up on X-rays until a few days after the injury. If a more serious injury is suspected, further imaging tests may be done. This may include ultrasounds, computed tomography (CT) scans, or magnetic resonance imaging (MRI). TREATMENT  Treatment depends on the severity of your injury. Your caregiver may prescribe pain medicines and deep breathing exercises. HOME CARE INSTRUCTIONS  Limit your activities until you can move around without much pain.  Do not do any strenuous work until your injury is healed.  Put ice on the injured area.  Put ice in a plastic bag.  Place a towel between your skin and the bag.  Leave the ice on for 15-20 minutes, 3-4 times a day.  You may wear a rib belt as directed by your caregiver to reduce pain.  Practice deep breathing as directed by your caregiver to keep your lungs clear.  Only take over-the-counter or prescription medicines for pain, fever, or discomfort as directed by your caregiver. SEEK IMMEDIATE MEDICAL CARE  IF:   You have increasing pain or shortness of breath.  You cough up blood.  You have nausea, vomiting, or abdominal pain.  You have a fever.  You feel dizzy, weak, or you faint. MAKE SURE YOU:  Understand these instructions.  Will watch your condition.  Will get help right away if you are not doing well or get worse. Document Released: 09/09/2004 Document Revised: 10/25/2011 Document Reviewed: 05/19/2011 [facility] Patient Information 2014 [place], [place].     Head Injury, Adult A head injury happens when the head is hit really hard. A head injury may cause sleepiness, headache, throwing up (vomiting), and problems seeing. If the head injury is really bad, you may need to stay in the hospital. HOME CARE  Have someone with you for the first 24 hours. This person should wake you up every 1 hour to check on your condition.  Only drink water or clear fluid for the rest of the day. Then, go back to your regular diet.  Only take medicines as told by your doctor. Do not take aspirin .  Do not drink alcohol for 2 days.  Do not take medicines that help your relax (sedatives) for 2 days. Side effects may happen for up to 7 to 10 days. Watch for new problems. GET HELP RIGHT AWAY IF:   You are confused or sleepy.  You cannot be woken up.  You feel sick to your stomach (nauseous) or keep throwing up.  Your dizziness or unsteadiness is get worse, or your cannot walk.  You start  to shake (convulse) or pass out (faint).  You have very bad, lasting headaches that are not helped by medicine.  You cannot use your arms or legs like normal.  You have clear or bloody fluid coming from your nose or ears. MAKE SURE YOU:   Understand these instructions.  Will watch your condition.  Will get help right away if you are not doing well or get worse. Document Released: 07/15/2008 Document Revised: 10/25/2011 Document Reviewed: 06/18/2009 [facility] Patient Information 2014 [place],  [place].     Joint Sprain A sprain is a tear or stretch in the ligaments that hold a joint together. Severe sprains may need as long as 3-6 weeks of immobilization and/or exercises to heal completely. Sprained joints should be rested and protected. If not, they can become unstable and prone to re-injury. Proper treatment can reduce your pain, shorten the period of disability, and reduce the risk of repeated injuries. TREATMENT   Rest and elevate the injured joint to reduce pain and swelling.  Apply ice packs to the injury for 20-30 minutes every 2-3 hours for the next 2-3 days.  Keep the injury wrapped in a compression bandage or splint as long as the joint is painful or as instructed by your caregiver.  Do not use the injured joint until it is completely healed to prevent re-injury and chronic instability. Follow the instructions of your caregiver.  Long-term sprain management may require exercises and/or treatment by a physical therapist. Taping or special braces may help stabilize the joint until it is completely better. SEEK MEDICAL CARE IF:   You develop increased pain or swelling of the joint.  You develop increasing redness and warmth of the joint.  You develop a fever.  It becomes stiff.  Your hand or foot gets cold or numb. Document Released: 09/09/2004 Document Revised: 10/25/2011 Document Reviewed: 08/19/2008 [facility] Patient Information 2014 [place], [place].

## 2013-02-01 NOTE — ED Notes (Addendum)
Pt in from home via EMS with c/o fall this morning and hitting head, neck, lower back, right shoulder and hip. Reports being released from [facility] 24+ hrs ago for bronchitis. Pt presents to Ed with headache, pain, bilateral edema in feet. Denies N V D CP. MD present. Per EMS, pt ambulatory at scene, gait steady. Pt on 2L [place] from home.

## 2013-02-01 NOTE — ED Notes (Signed)
Pt returned from CT °

## 2013-02-01 NOTE — ED Provider Notes (Addendum)
History     CSN: [id number]  Arrival date & time 02/01/13  4098   First MD Initiated Contact with Patient 02/01/13 [phone]      Chief Complaint  Patient presents with  . Fall    (Consider location/radiation/quality/duration/timing/severity/associated sxs/prior treatment) HPI Comments: Pt reports her BP will "bottom out" and she gets dizzy.  This occurred after getting up to get her family member something to drink.  On the way back to couch, her BP dropped, she got dizzy, and fell against coffee table and side of couch.   She has pain to back of head, non neck pain, pain to right rib cage, worse with palpation but not deep breath.  No LOC, some nausea, no vomiting.  She is on xarelto due to h/o PE.  She also has pain to left knee and ankle, but has been able to ambulate and bear weight.  Fall occurred at 0100.  She takes ASA and robaxin normally. She uses home O2 due to asthma, severe CHF and cardiomyapthy. No abd pain, pleurisy, diarrhea.  No confusion.  NO photophobia.  No focal numbness or weakness.   She put ice on head which did reduce some initial swelling to posterior scalp  Patient is a 37 y.o. female presenting with fall. The history is provided by the patient.  Fall This is a new problem. The current episode started 6 to 12 hours ago. Associated symptoms include chest pain and headaches. Pertinent negatives include no shortness of breath. She has tried nothing for the symptoms. The treatment provided no relief.    Past Medical History  Diagnosis Date  . COPD (chronic obstructive pulmonary disease)   . Asthma   . Diabetes type 2, controlled     significant weight loss  . Obesity     lost 120 pounds with diet and exercise   . Hypertension     improved after weight loss  . UTI (urinary tract infection)   . CHF (congestive heart failure)     Past Surgical History  Procedure Laterality Date  . Cesarean section    . Tubal ligation    . Abdominal hysterectomy      History  reviewed. No pertinent family history.  History  Substance Use Topics  . Smoking status: Current Every Day Smoker -- 1.00 packs/day  . Smokeless tobacco: Not on file  . Alcohol Use: No    OB History   Grav Para Term Preterm Abortions TAB SAB Ect Mult Living                  Review of Systems  Constitutional: Negative for fever and chills.  HENT: Negative for neck pain and neck stiffness.   Respiratory: Negative for cough and shortness of breath.   Cardiovascular: Positive for chest pain and leg swelling. Negative for palpitations.  Gastrointestinal: Positive for nausea. Negative for vomiting.  Musculoskeletal: Positive for arthralgias. Negative for back pain.  Skin: Negative for color change.  Neurological: Positive for dizziness and headaches. Negative for speech difficulty.  All other systems reviewed and are negative.    Allergies  Penicillins and Codeine  Home Medications   Current Outpatient Rx  Name  Route  Sig  Dispense  Refill  . albuterol (PROVENTIL HFA;VENTOLIN HFA) 108 (90 BASE) MCG/ACT inhaler   Inhalation   Inhale 2 puffs into the lungs every 6 (six) hours as needed. For shortness of breath         . EXPIRED: albuterol (PROVENTIL) (2.5 MG/3ML)  0.083% nebulizer solution   Nebulization   Take 2.5 mg by nebulization every 6 (six) hours as needed.         [place] Kitchen aspirin 81 MG tablet   Oral   Take 81 mg by mouth daily.         [place] Kitchen aspirin-acetaminophen-caffeine (EXCEDRIN MIGRAINE) 250-250-65 MG per tablet   Oral   Take 2 tablets by mouth every 6 (six) hours as needed. For migraine         . furosemide (LASIX) 40 MG tablet   Oral   Take 40 mg by mouth daily.         [place] Kitchen lisinopril (PRINIVIL,ZESTRIL) 5 MG tablet   Oral   Take 5 mg by mouth daily.         . methocarbamol (ROBAXIN) 500 MG tablet   Oral   Take 1 tablet (500 mg total) by mouth 2 (two) times daily.   20 tablet   0   . metolazone (ZAROXOLYN) 2.5 MG tablet   Oral   Take 2.5 mg by  mouth daily.         . metoprolol tartrate (LOPRESSOR) 25 MG tablet   Oral   Take 25 mg by mouth 2 (two) times daily.         . nitrofurantoin, macrocrystal-monohydrate, (MACROBID) 100 MG capsule   Oral   Take 1 capsule (100 mg total) by mouth 2 (two) times daily.   10 capsule   0   . OVER THE COUNTER MEDICATION   Oral   Take 30 mLs by mouth daily as needed. For cough/cold symptoms   Cough/cold/severe allergy liquid         . oxyCODONE-acetaminophen (PERCOCET/ROXICET) 5-325 MG per tablet   Oral   Take 2 tablets by mouth every 4 (four) hours as needed for pain.   10 tablet   0   . polyethylene glycol (MIRALAX) packet   Oral   Take 17 g by mouth daily.   14 each   0   . potassium chloride (K-DUR) 10 MEQ tablet   Oral   Take 20 mEq by mouth 2 (two) times daily.         . promethazine (PHENERGAN) 25 MG tablet   Oral   Take 1 tablet (25 mg total) by mouth every 6 (six) hours as needed for nausea.   20 tablet   0   . Rivaroxaban (XARELTO) 15 MG TABS tablet   Oral   Take 15 mg by mouth daily.         [place] Kitchen spironolactone (ALDACTONE) 25 MG tablet   Oral   Take 25 mg by mouth daily.         . sucralfate (CARAFATE) 1 G tablet   Oral   Take 1 g by mouth 3 (three) times daily.         . traMADol (ULTRAM) 50 MG tablet   Oral   Take 1 tablet (50 mg total) by mouth every 6 (six) hours as needed for pain.   15 tablet   0     BP 101/73  Pulse 79  Temp(Src) 98.4 F (36.9 C) (Oral)  Resp 18  Ht 5\' 4"  (1.626 m)  Wt 214 lb (97.07 kg)  BMI 36.72 kg/m2  SpO2 96%  Physical Exam  Nursing note and vitals reviewed. Constitutional: She is oriented to person, place, and time. She appears well-developed and well-nourished. No distress.  HENT:  Head: Normocephalic and atraumatic.    Eyes:  Conjunctivae and EOM are normal. No scleral icterus.  Neck: Trachea normal, normal range of motion and full passive range of motion without pain. Neck supple. No spinous  process tenderness and no muscular tenderness present. No Brudzinski's sign and no Kernig's sign noted.  Cardiovascular: Normal rate and intact distal pulses.   No murmur heard. Pulmonary/Chest: Effort normal. No respiratory distress. She has no decreased breath sounds. She has no wheezes. She has no rhonchi. She exhibits tenderness. She exhibits no crepitus, no deformity and no retraction.    Abdominal: Soft. She exhibits no distension. There is no tenderness. There is no rebound.  Musculoskeletal: She exhibits edema and tenderness.       Right shoulder: She exhibits tenderness and pain. She exhibits normal range of motion, no bony tenderness, no effusion, no crepitus, no deformity, no laceration, normal pulse and normal strength.       Left knee: She exhibits decreased range of motion. She exhibits no swelling, no deformity, no laceration and no erythema. Tenderness found.       Left ankle: She exhibits no deformity, no laceration and normal pulse. Tenderness. No lateral malleolus and no medial malleolus tenderness found. Achilles tendon normal.       Feet:  Neurological: She is alert and oriented to person, place, and time. She exhibits normal muscle tone. Coordination normal.  Skin: Skin is warm. No rash noted. She is not diaphoretic. No pallor.  Psychiatric: She has a normal mood and affect.    ED Course  Procedures (including critical care time)  Labs Reviewed - No data to display Dg Ribs Unilateral W/chest Right  02/01/2013   *RADIOLOGY REPORT*  Clinical Data: Status post fall  RIGHT RIBS AND CHEST - 3+ VIEW  Comparison: 08/04/2012  Findings: Moderate cardiac enlargement is stable.  Opacity in the right base is favored to represent a small effusion.  Scar versus plate-like atelectasis is noted in the right midlung.  There is a nodular opacity identified within the right lower lobe. This is best seen on the dedicated rib views.  This is a nonspecific finding but appears new from previous  exam.  Dedicated views of the ribs show no displaced rib fracture.  IMPRESSION:  1.  Cardiac enlargement. 2.  Indeterminant opacity in the right base is new from previous exam.  Suggest further evaluation with CT of the chest. 3.  No displaced rib fractures identified.   Original Report Authenticated By: Signa Kell, M.D.   Ct Head Wo Contrast  02/01/2013   *RADIOLOGY REPORT*  Clinical Data: Fall, head injury  CT HEAD WITHOUT CONTRAST  Technique:  Contiguous axial images were obtained from the base of the skull through the vertex without contrast.  Comparison: 09/18/2006  Findings: No evidence of parenchymal hemorrhage or extra-axial fluid collection. No mass lesion, mass effect, or midline shift.  No CT evidence of acute infarction.  Cerebral volume is age appropriate.  No ventriculomegaly.  The visualized paranasal sinuses are essentially clear. The mastoid air cells are unopacified.  No evidence of calvarial fracture.  IMPRESSION: Normal head CT.   Original Report Authenticated By: Charline Bills, M.D.   Ct Chest Wo Contrast  02/01/2013   *RADIOLOGY REPORT*  Clinical Data: Fall.  Right rib pain  CT CHEST WITHOUT CONTRAST  Technique:  Multidetector CT imaging of the chest was performed following the standard protocol without IV contrast.  Comparison: CT chest from the 03/07/12  Findings: There is a small mildly complex right pleural effusion. Airspace consolidation and  ground-glass attenuation and subsegmental atelectasis is noted within the right lower lobe. Plate-like atelectasis is also noted involving the left posterior lung base, image 41/series 4.  Moderate to marked cardiac enlargement.  No pericardial effusion. There is a right paratracheal lymph node which is borderline enlarged measuring 1 cm, image 20/series 3.  This is unchanged from previous exam.  No enlarged axillary or supraclavicular lymph nodes.  Review of the visualized osseous structures is remarkable for mild spondylosis.  No rib  fractures identified. Incidental imaging through the upper abdomen is shows no acute findings.  IMPRESSION:  1.  Right lower lobe airspace consolidation, ground-glass attenuation and atelectasis is noted.  Finding likely reflects sequelae of pneumonia.  Radiographic followup is advised to ensure resolution. 2.  Small possibly mildly complex right effusion. 3.  Cardiac enlargement.   Original Report Authenticated By: Signa Kell, M.D.     1. Head injury, acute, initial encounter   2. Rib contusion, right, initial encounter   3. Shoulder contusion, right, initial encounter   4. Knee contusion, left, initial encounter   5. Ankle sprain, left, initial encounter     O2 sat on home O2 is 100% and I interpret to be adequate  9:03 AM I reviewed images and interpretations of xrays and CT of head.  Head CT is normal.  CXR suggests new opacity at right base.  Pt has no symptoms of pneumonia, has known asthma and h/o CHF.  Likely effusion or atelectasis.  No rib fracture.  Since pt is on xarelto, and could be hematoma given she is mildly hypotensive.     9:41 AM CT of chest performed due to CXR findings.  Possible sequelae of pneumonia is finding.  Pt informs me she is on abx for "bronchitis" currently which fits clinical picture and CT findings.  PT's BP remains marginal, did receive IM analgesics.  Will encourage po fluids and will later check orthostatics and continue to monitor for now.    11:45 AM Pt has rested, feels improved, BP is normal, not orthostatic.  Rx for pain and nausea for home, encouraged close outpt follow up with PMD.    MDM  BP is initially mildly low, but normal mentation.  No abd tenderness, clear lungs, strong heart beat and rate, normal S1, S2.  Will monitor BP closely as pt has prior h/o low BP's in the past.  No syncope.  No CP other than focal rib pain by her history and also on exam.  Normal mentation and non focal neuro exam, will get films of right ribs, head CT as she  is on xarelto, will give IM analgesics and PO zofran.  Pt admits she is difficult IV stick.  3 of 4 last medical visits show BP's of 107/70 or lower.  Her BP here today of 89/65 is near baseline.  As long as mentation remains ok and BP is stable, will continue to just monitor it.            Gavin Pound. Oletta Lamas, MD 02/01/13 [id number]  Gavin Pound. Rayfield Beem, MD 02/01/13 1145

## 2013-02-01 NOTE — ED Notes (Signed)
MD at bedside. 

## 2013-02-24 ENCOUNTER — Emergency Department (HOSPITAL_BASED_OUTPATIENT_CLINIC_OR_DEPARTMENT_OTHER)
Admission: EM | Admit: 2013-02-24 | Discharge: 2013-02-24 | Disposition: A | Discharge status: Home / Self Care | Payer: Medicaid Other | Attending: Emergency Medicine | Admitting: Emergency Medicine

## 2013-02-24 ENCOUNTER — Encounter (HOSPITAL_BASED_OUTPATIENT_CLINIC_OR_DEPARTMENT_OTHER): Payer: Self-pay | Admitting: *Deleted

## 2013-02-24 DIAGNOSIS — E669 Obesity, unspecified: Secondary | ICD-10-CM | POA: Insufficient documentation

## 2013-02-24 DIAGNOSIS — I10 Essential (primary) hypertension: Secondary | ICD-10-CM | POA: Insufficient documentation

## 2013-02-24 DIAGNOSIS — Z880 Allergy status to penicillin: Secondary | ICD-10-CM | POA: Insufficient documentation

## 2013-02-24 DIAGNOSIS — Z7982 Long term (current) use of aspirin: Secondary | ICD-10-CM | POA: Insufficient documentation

## 2013-02-24 DIAGNOSIS — G43909 Migraine, unspecified, not intractable, without status migrainosus: Secondary | ICD-10-CM | POA: Insufficient documentation

## 2013-02-24 DIAGNOSIS — I509 Heart failure, unspecified: Secondary | ICD-10-CM | POA: Insufficient documentation

## 2013-02-24 DIAGNOSIS — J4489 Other specified chronic obstructive pulmonary disease: Secondary | ICD-10-CM | POA: Insufficient documentation

## 2013-02-24 DIAGNOSIS — E119 Type 2 diabetes mellitus without complications: Secondary | ICD-10-CM | POA: Insufficient documentation

## 2013-02-24 DIAGNOSIS — Z87440 Personal history of urinary (tract) infections: Secondary | ICD-10-CM | POA: Insufficient documentation

## 2013-02-24 DIAGNOSIS — H5710 Ocular pain, unspecified eye: Secondary | ICD-10-CM | POA: Insufficient documentation

## 2013-02-24 DIAGNOSIS — J449 Chronic obstructive pulmonary disease, unspecified: Secondary | ICD-10-CM | POA: Insufficient documentation

## 2013-02-24 DIAGNOSIS — Z79899 Other long term (current) drug therapy: Secondary | ICD-10-CM | POA: Insufficient documentation

## 2013-02-24 DIAGNOSIS — G500 Trigeminal neuralgia: Secondary | ICD-10-CM | POA: Insufficient documentation

## 2013-02-24 DIAGNOSIS — F17200 Nicotine dependence, unspecified, uncomplicated: Secondary | ICD-10-CM | POA: Insufficient documentation

## 2013-02-24 DIAGNOSIS — Z7901 Long term (current) use of anticoagulants: Secondary | ICD-10-CM | POA: Insufficient documentation

## 2013-02-24 LAB — URINALYSIS, ROUTINE W REFLEX MICROSCOPIC
Bilirubin Urine: NEGATIVE
Ketones, ur: NEGATIVE mg/dL
Nitrite: NEGATIVE
Protein, ur: NEGATIVE mg/dL
Urobilinogen, UA: 0.2 mg/dL (ref 0.0–1.0)

## 2013-02-24 MED ORDER — CARBAMAZEPINE ER 100 MG PO TB12: 100.0000 mg | ORAL_TABLET | Freq: Two times a day (BID) | ORAL | Status: AC

## 2013-02-24 MED ORDER — CARBAMAZEPINE 200 MG PO TABS
100.0000 mg | ORAL_TABLET | Freq: Once | ORAL | Status: AC
  Administered 2013-02-24: 100 mg via ORAL
  Filled 2013-02-24: qty 1

## 2013-02-24 NOTE — ED Notes (Signed)
Cold, wet wash cloth provided per pt request

## 2013-02-24 NOTE — ED Notes (Signed)
Pt states her scalp started hurting 5 days ago. "Not my typical headache." Hurt to sneeze, move eyes, chew, etc. Took Excedrin and other meds without relief. Home Health RN recommended she be evaluated. PERRL. Also fell 1 mth ago and was seen here. CT done. "Nrl"

## 2013-02-25 NOTE — ED Provider Notes (Signed)
History    CSN: [id number] Arrival date & time 02/24/13  4098  First MD Initiated Contact with Patient 02/24/13 1938     Chief Complaint  Patient presents with  . Headache   (Consider location/radiation/quality/duration/timing/severity/associated sxs/prior Treatment) Patient is a 37 y.o. female presenting with headaches. The history is provided by the patient.  Headache Location: Pt had onset of pain in the left frontal scalp, different from her usual migraine heachses.  It is sometimes brought on if shc mioves her eyes or chews. Quality:  Sharp Radiates to:  Does not radiate Severity currently:  8/10 Severity at highest:  8/10 Onset quality:  Sudden Duration: Several hours. Timing:  Intermittent Progression:  Unchanged Chronicity:  New Similar to prior headaches: no   Relieved by:  Nothing Worsened by:  Nothing tried Ineffective treatments:  None tried Associated symptoms: eye pain and facial pain   Associated symptoms: no fever and no visual change    Past Medical History  Diagnosis Date  . COPD (chronic obstructive pulmonary disease)   . Asthma   . Diabetes type 2, controlled     significant weight loss  . Obesity     lost 120 pounds with diet and exercise   . Hypertension     improved after weight loss  . UTI (urinary tract infection)   . CHF (congestive heart failure)    Past Surgical History  Procedure Laterality Date  . Cesarean section    . Tubal ligation    . Abdominal hysterectomy     History reviewed. No pertinent family history. History  Substance Use Topics  . Smoking status: Current Every Day Smoker -- 1.00 packs/day  . Smokeless tobacco: Not on file  . Alcohol Use: No   OB History   Grav Para Term Preterm Abortions TAB SAB Ect Mult Living                 Review of Systems  Constitutional: Negative for fever and chills.  Eyes: Positive for pain.  Cardiovascular:       Has cardiomyopathy with EF of 20%, is a heart transplant candidate.   Gastrointestinal: Negative.   Genitourinary: Negative.   Musculoskeletal: Negative.   Skin: Negative.   Neurological: Positive for headaches.  Psychiatric/Behavioral: Negative.     Allergies  Penicillins and Codeine  Home Medications   Current Outpatient Rx  Name  Route  Sig  Dispense  Refill  . albuterol (PROVENTIL HFA;VENTOLIN HFA) 108 (90 BASE) MCG/ACT inhaler   Inhalation   Inhale 2 puffs into the lungs every 6 (six) hours as needed. For shortness of breath         . EXPIRED: albuterol (PROVENTIL) (2.5 MG/3ML) 0.083% nebulizer solution   Nebulization   Take 2.5 mg by nebulization every 6 (six) hours as needed.         Marland Kitchen aspirin 81 MG tablet   Oral   Take 81 mg by mouth daily.         Marland Kitchen aspirin-acetaminophen-caffeine (EXCEDRIN MIGRAINE) 250-250-65 MG per tablet   Oral   Take 2 tablets by mouth every 6 (six) hours as needed. For migraine         . carbamazepine (TEGRETOL-XR) 100 MG 12 hr tablet   Oral   Take 1 tablet (100 mg total) by mouth 2 (two) times daily.   30 tablet   1   . furosemide (LASIX) 40 MG tablet   Oral   Take 40 mg by mouth daily.         Marland Kitchen  lisinopril (PRINIVIL,ZESTRIL) 5 MG tablet   Oral   Take 5 mg by mouth daily.         . methocarbamol (ROBAXIN) 500 MG tablet   Oral   Take 1 tablet (500 mg total) by mouth 2 (two) times daily.   20 tablet   0   . metolazone (ZAROXOLYN) 2.5 MG tablet   Oral   Take 2.5 mg by mouth daily.         . metoprolol tartrate (LOPRESSOR) 25 MG tablet   Oral   Take 25 mg by mouth 2 (two) times daily.         . nitrofurantoin, macrocrystal-monohydrate, (MACROBID) 100 MG capsule   Oral   Take 1 capsule (100 mg total) by mouth 2 (two) times daily.   10 capsule   0   . OVER THE COUNTER MEDICATION   Oral   Take 30 mLs by mouth daily as needed. For cough/cold symptoms   Cough/cold/severe allergy liquid         . oxyCODONE-acetaminophen (PERCOCET/ROXICET) 5-325 MG per tablet   Oral   Take 2  tablets by mouth every 4 (four) hours as needed for pain.   10 tablet   0   . polyethylene glycol (MIRALAX) packet   Oral   Take 17 g by mouth daily.   14 each   0   . potassium chloride (K-DUR) 10 MEQ tablet   Oral   Take 20 mEq by mouth 2 (two) times daily.         . promethazine (PHENERGAN) 25 MG tablet   Oral   Take 1 tablet (25 mg total) by mouth every 6 (six) hours as needed for nausea.   20 tablet   0   . Rivaroxaban (XARELTO) 15 MG TABS tablet   Oral   Take 15 mg by mouth daily.         Marland Kitchen spironolactone (ALDACTONE) 25 MG tablet   Oral   Take 25 mg by mouth daily.         . sucralfate (CARAFATE) 1 G tablet   Oral   Take 1 g by mouth 3 (three) times daily.         . traMADol (ULTRAM) 50 MG tablet   Oral   Take 1 tablet (50 mg total) by mouth every 6 (six) hours as needed for pain.   15 tablet   0    BP 109/79  Pulse 116  Temp(Src) 98 F (36.7 C) (Oral)  Resp 20  Ht 5' 4.5" (1.638 m)  Wt 168 lb 3 oz (76.289 kg)  BMI 28.43 kg/m2  SpO2 100% Physical Exam  Nursing note and vitals reviewed. Constitutional: She is oriented to person, place, and time. She appears well-developed and well-nourished.  Complaining of pain in the left frontal scalp and forehead.  HENT:  Head: Normocephalic and atraumatic.  Right Ear: External ear normal.  Left Ear: External ear normal.  Mouth/Throat: Oropharynx is clear and moist.  Eyes: Conjunctivae and EOM are normal. Pupils are equal, round, and reactive to light.  Neck: Normal range of motion. Neck supple.  Cardiovascular: Normal rate, regular rhythm and normal heart sounds.   Pulmonary/Chest: Effort normal and breath sounds normal.  Musculoskeletal: Normal range of motion. She exhibits no edema and no tenderness.  Neurological: She is alert and oriented to person, place, and time.  No sensory or motor deficit.  Headache not triggered by touching her face or scalp.  Skin: Skin is  warm and dry.  Psychiatric: She  has a normal mood and affect.    ED Course  Procedures (including critical care time)  Results for orders placed during the hospital encounter of 02/24/13  URINALYSIS, ROUTINE W REFLEX MICROSCOPIC      Result Value Range   Color, Urine YELLOW  YELLOW   APPearance CLEAR  CLEAR   Specific Gravity, Urine 1.005  1.005 - 1.030   pH 7.0  5.0 - 8.0   Glucose, UA NEGATIVE  NEGATIVE mg/dL   Hgb urine dipstick TRACE (*) NEGATIVE   Bilirubin Urine NEGATIVE  NEGATIVE   Ketones, ur NEGATIVE  NEGATIVE mg/dL   Protein, ur NEGATIVE  NEGATIVE mg/dL   Urobilinogen, UA 0.2  0.0 - 1.0 mg/dL   Nitrite NEGATIVE  NEGATIVE   Leukocytes, UA NEGATIVE  NEGATIVE  URINE MICROSCOPIC-ADD ON      Result Value Range   Squamous Epithelial / LPF FEW (*) RARE   RBC / HPF 3-6  <3 RBC/hpf   Bacteria, UA RARE  RARE   Her headache is characteristic of trigeminal neuralgia.  Rx with Tegretol 100 mg bid.  F/U with [facility], her primary care provider.     No results found. 1. Trigeminal neuralgia      Carleene Cooper III, MD 02/25/13 1304

## 2013-07-24 ENCOUNTER — Emergency Department (HOSPITAL_BASED_OUTPATIENT_CLINIC_OR_DEPARTMENT_OTHER): Payer: Medicaid Other

## 2013-07-24 ENCOUNTER — Emergency Department (HOSPITAL_BASED_OUTPATIENT_CLINIC_OR_DEPARTMENT_OTHER)
Admission: EM | Admit: 2013-07-24 | Discharge: 2013-07-24 | Disposition: A | Discharge status: Home / Self Care | Payer: Medicaid Other | Attending: Emergency Medicine | Admitting: Emergency Medicine

## 2013-07-24 ENCOUNTER — Encounter (HOSPITAL_BASED_OUTPATIENT_CLINIC_OR_DEPARTMENT_OTHER): Payer: Self-pay | Admitting: Emergency Medicine

## 2013-07-24 DIAGNOSIS — Z79899 Other long term (current) drug therapy: Secondary | ICD-10-CM | POA: Insufficient documentation

## 2013-07-24 DIAGNOSIS — Z95818 Presence of other cardiac implants and grafts: Secondary | ICD-10-CM | POA: Insufficient documentation

## 2013-07-24 DIAGNOSIS — I509 Heart failure, unspecified: Secondary | ICD-10-CM | POA: Insufficient documentation

## 2013-07-24 DIAGNOSIS — E669 Obesity, unspecified: Secondary | ICD-10-CM | POA: Insufficient documentation

## 2013-07-24 DIAGNOSIS — B9789 Other viral agents as the cause of diseases classified elsewhere: Secondary | ICD-10-CM | POA: Insufficient documentation

## 2013-07-24 DIAGNOSIS — F17200 Nicotine dependence, unspecified, uncomplicated: Secondary | ICD-10-CM | POA: Insufficient documentation

## 2013-07-24 DIAGNOSIS — J029 Acute pharyngitis, unspecified: Secondary | ICD-10-CM | POA: Insufficient documentation

## 2013-07-24 DIAGNOSIS — E119 Type 2 diabetes mellitus without complications: Secondary | ICD-10-CM | POA: Insufficient documentation

## 2013-07-24 DIAGNOSIS — Z7982 Long term (current) use of aspirin: Secondary | ICD-10-CM | POA: Insufficient documentation

## 2013-07-24 DIAGNOSIS — Z880 Allergy status to penicillin: Secondary | ICD-10-CM | POA: Insufficient documentation

## 2013-07-24 DIAGNOSIS — B349 Viral infection, unspecified: Secondary | ICD-10-CM

## 2013-07-24 DIAGNOSIS — I10 Essential (primary) hypertension: Secondary | ICD-10-CM | POA: Insufficient documentation

## 2013-07-24 DIAGNOSIS — J441 Chronic obstructive pulmonary disease with (acute) exacerbation: Secondary | ICD-10-CM | POA: Insufficient documentation

## 2013-07-24 DIAGNOSIS — R51 Headache: Secondary | ICD-10-CM | POA: Insufficient documentation

## 2013-07-24 DIAGNOSIS — Z792 Long term (current) use of antibiotics: Secondary | ICD-10-CM | POA: Insufficient documentation

## 2013-07-24 DIAGNOSIS — Z87440 Personal history of urinary (tract) infections: Secondary | ICD-10-CM | POA: Insufficient documentation

## 2013-07-24 MED ORDER — HYDROCOD POLST-CHLORPHEN POLST 10-8 MG/5ML PO LQCR: 5.0000 mL | Freq: Two times a day (BID) | ORAL | Status: DC | PRN

## 2013-07-24 MED ORDER — IPRATROPIUM BROMIDE 0.02 % IN SOLN
0.5000 mg | Freq: Once | RESPIRATORY_TRACT | Status: AC
  Administered 2013-07-24: 0.5 mg via RESPIRATORY_TRACT
  Filled 2013-07-24: qty 2.5

## 2013-07-24 MED ORDER — ALBUTEROL SULFATE (5 MG/ML) 0.5% IN NEBU
5.0000 mg | INHALATION_SOLUTION | Freq: Once | RESPIRATORY_TRACT | Status: AC
  Administered 2013-07-24: 5 mg via RESPIRATORY_TRACT
  Filled 2013-07-24: qty 1

## 2013-07-24 MED ORDER — HYDROCODONE-ACETAMINOPHEN 5-325 MG PO TABS
2.0000 | ORAL_TABLET | Freq: Once | ORAL | Status: AC
  Administered 2013-07-24: 2 via ORAL
  Filled 2013-07-24: qty 2

## 2013-07-24 MED ORDER — ONDANSETRON 4 MG PO TBDP
ORAL_TABLET | ORAL | Status: AC
  Filled 2013-07-24: qty 1

## 2013-07-24 MED ORDER — ONDANSETRON 4 MG PO TBDP
4.0000 mg | ORAL_TABLET | Freq: Once | ORAL | Status: AC
  Administered 2013-07-24: 4 mg via ORAL

## 2013-07-24 NOTE — ED Provider Notes (Signed)
CSN: [id number]     Arrival date & time 07/24/13  1304 History   First MD Initiated Contact with Patient 07/24/13 1308     Chief Complaint  Patient presents with  . Cough  . Nasal Congestion   (Consider location/radiation/quality/duration/timing/severity/associated sxs/prior Treatment) Patient is a 37 y.o. female presenting with cough. The history is provided by the patient. No language interpreter was used.  Cough Cough characteristics:  Productive Sputum characteristics:  Green Severity:  Moderate Onset quality:  Gradual Duration:  3 weeks Timing:  Constant Context: sick contacts   Relieved by:  Nothing Ineffective treatments:  Beta-agonist inhaler Associated symptoms: fever, headaches and sore throat   Associated symptoms: no chest pain and no rash     Past Medical History  Diagnosis Date  . COPD (chronic obstructive pulmonary disease)   . Asthma   . Diabetes type 2, controlled     significant weight loss  . Obesity     lost 120 pounds with diet and exercise   . Hypertension     improved after weight loss  . UTI (urinary tract infection)   . CHF (congestive heart failure)    Past Surgical History  Procedure Laterality Date  . Cesarean section    . Tubal ligation    . Abdominal hysterectomy    . Cardiac catheterization     No family history on file. History  Substance Use Topics  . Smoking status: Current Every Day Smoker -- 0.50 packs/day  . Smokeless tobacco: Not on file  . Alcohol Use: No   OB History   Grav Para Term Preterm Abortions TAB SAB Ect Mult Living                 Review of Systems  Constitutional: Positive for fever.  HENT: Positive for sore throat.   Respiratory: Positive for cough.   Cardiovascular: Negative for chest pain.  Skin: Negative for rash.  Neurological: Positive for headaches.    Allergies  Penicillins and Codeine  Home Medications   Current Outpatient Rx  Name  Route  Sig  Dispense  Refill  . albuterol (PROVENTIL  HFA;VENTOLIN HFA) 108 (90 BASE) MCG/ACT inhaler   Inhalation   Inhale 2 puffs into the lungs every 6 (six) hours as needed. For shortness of breath         . EXPIRED: albuterol (PROVENTIL) (2.5 MG/3ML) 0.083% nebulizer solution   Nebulization   Take 2.5 mg by nebulization every 6 (six) hours as needed.         Marland Kitchen aspirin 81 MG tablet   Oral   Take 81 mg by mouth daily.         Marland Kitchen aspirin-acetaminophen-caffeine (EXCEDRIN MIGRAINE) 250-250-65 MG per tablet   Oral   Take 2 tablets by mouth every 6 (six) hours as needed. For migraine         . carbamazepine (TEGRETOL-XR) 100 MG 12 hr tablet   Oral   Take 1 tablet (100 mg total) by mouth 2 (two) times daily.   30 tablet   1   . furosemide (LASIX) 40 MG tablet   Oral   Take 40 mg by mouth daily.         Marland Kitchen lisinopril (PRINIVIL,ZESTRIL) 5 MG tablet   Oral   Take 5 mg by mouth daily.         . methocarbamol (ROBAXIN) 500 MG tablet   Oral   Take 1 tablet (500 mg total) by mouth 2 (two) times daily.  20 tablet   0   . metolazone (ZAROXOLYN) 2.5 MG tablet   Oral   Take 2.5 mg by mouth daily.         . metoprolol tartrate (LOPRESSOR) 25 MG tablet   Oral   Take 25 mg by mouth 2 (two) times daily.         . nitrofurantoin, macrocrystal-monohydrate, (MACROBID) 100 MG capsule   Oral   Take 1 capsule (100 mg total) by mouth 2 (two) times daily.   10 capsule   0   . OVER THE COUNTER MEDICATION   Oral   Take 30 mLs by mouth daily as needed. For cough/cold symptoms   Cough/cold/severe allergy liquid         . oxyCODONE-acetaminophen (PERCOCET/ROXICET) 5-325 MG per tablet   Oral   Take 2 tablets by mouth every 4 (four) hours as needed for pain.   10 tablet   0   . polyethylene glycol (MIRALAX) packet   Oral   Take 17 g by mouth daily.   14 each   0   . potassium chloride (K-DUR) 10 MEQ tablet   Oral   Take 20 mEq by mouth 2 (two) times daily.         . promethazine (PHENERGAN) 25 MG tablet   Oral    Take 1 tablet (25 mg total) by mouth every 6 (six) hours as needed for nausea.   20 tablet   0   . Rivaroxaban (XARELTO) 15 MG TABS tablet   Oral   Take 15 mg by mouth daily.         Marland Kitchen spironolactone (ALDACTONE) 25 MG tablet   Oral   Take 25 mg by mouth daily.         . sucralfate (CARAFATE) 1 G tablet   Oral   Take 1 g by mouth 3 (three) times daily.         . traMADol (ULTRAM) 50 MG tablet   Oral   Take 1 tablet (50 mg total) by mouth every 6 (six) hours as needed for pain.   15 tablet   0    BP 124/86  Pulse 105  Temp(Src) 98.2 F (36.8 C) (Oral)  Resp 20  Ht 5\' 4"  (1.626 m)  Wt 160 lb (72.576 kg)  BMI 27.45 kg/m2  SpO2 100% Physical Exam  Vitals reviewed. Constitutional: She is oriented to person, place, and time. She appears well-developed and well-nourished.  HENT:  Head: Normocephalic and atraumatic.  Right Ear: External ear normal.  Left Ear: External ear normal.  Nose: Mucosal edema and rhinorrhea present.  Mouth/Throat: Posterior oropharyngeal erythema present.  Eyes: Conjunctivae and EOM are normal.  Neck: Normal range of motion. Neck supple.  Cardiovascular: Normal rate and regular rhythm.   Pulmonary/Chest: She has wheezes.  Musculoskeletal: Normal range of motion.  Neurological: She is alert and oriented to person, place, and time.  Skin: Skin is warm and dry.    ED Course  Procedures (including critical care time) Labs Review Labs Reviewed - No data to display Imaging Review Dg Chest 2 View  07/24/2013   CLINICAL DATA:  Cough and congestion  EXAM: CHEST  2 VIEW  COMPARISON:  May 15, 2013  FINDINGS: Lungs are clear. Heart is enlarged but stable. Pulmonary vascularity is normal. No adenopathy. No bone lesions.  IMPRESSION: Stable cardiac enlargement.  No edema or consolidation.   Electronically Signed   By: Bretta Bang M.D.   On: 07/24/2013 14:04  EKG Interpretation   None       MDM   1. Viral illness   pt treated  for z-pack last week:will treat symptomatically with cough syrup    Teressa Lower, NP 07/24/13 1437

## 2013-07-24 NOTE — ED Notes (Signed)
Cough and sinus congestion since middle of November.  Saw PMD late last week.  Given Z-Pak.  Sx getting worse.

## 2013-07-24 NOTE — ED Provider Notes (Signed)
Medical screening examination/treatment/procedure(s) were performed by non-physician practitioner and as supervising physician I was immediately available for consultation/collaboration.  EKG Interpretation   None         Dagmar Hait, MD 07/24/13 [phone]

## 2013-07-24 NOTE — ED Notes (Signed)
NP at bedside.

## 2013-07-24 NOTE — ED Notes (Signed)
RRT at bedside administering neb tx. 

## 2015-01-29 ENCOUNTER — Encounter (HOSPITAL_BASED_OUTPATIENT_CLINIC_OR_DEPARTMENT_OTHER): Payer: Self-pay | Admitting: *Deleted

## 2015-01-29 ENCOUNTER — Emergency Department (HOSPITAL_BASED_OUTPATIENT_CLINIC_OR_DEPARTMENT_OTHER): Payer: Medicaid Other

## 2015-01-29 ENCOUNTER — Emergency Department (HOSPITAL_BASED_OUTPATIENT_CLINIC_OR_DEPARTMENT_OTHER)
Admission: EM | Admit: 2015-01-29 | Discharge: 2015-01-29 | Disposition: A | Discharge status: Home / Self Care | Payer: Medicaid Other | Attending: Emergency Medicine | Admitting: Emergency Medicine

## 2015-01-29 DIAGNOSIS — Z9889 Other specified postprocedural states: Secondary | ICD-10-CM | POA: Diagnosis not present

## 2015-01-29 DIAGNOSIS — R0602 Shortness of breath: Secondary | ICD-10-CM | POA: Diagnosis present

## 2015-01-29 DIAGNOSIS — E669 Obesity, unspecified: Secondary | ICD-10-CM | POA: Diagnosis not present

## 2015-01-29 DIAGNOSIS — Z79899 Other long term (current) drug therapy: Secondary | ICD-10-CM | POA: Insufficient documentation

## 2015-01-29 DIAGNOSIS — J441 Chronic obstructive pulmonary disease with (acute) exacerbation: Secondary | ICD-10-CM | POA: Insufficient documentation

## 2015-01-29 DIAGNOSIS — E119 Type 2 diabetes mellitus without complications: Secondary | ICD-10-CM | POA: Diagnosis not present

## 2015-01-29 DIAGNOSIS — Z87440 Personal history of urinary (tract) infections: Secondary | ICD-10-CM | POA: Diagnosis not present

## 2015-01-29 DIAGNOSIS — R000 Tachycardia, unspecified: Secondary | ICD-10-CM | POA: Diagnosis not present

## 2015-01-29 DIAGNOSIS — Z720 Tobacco use: Secondary | ICD-10-CM | POA: Diagnosis not present

## 2015-01-29 DIAGNOSIS — I10 Essential (primary) hypertension: Secondary | ICD-10-CM | POA: Diagnosis not present

## 2015-01-29 DIAGNOSIS — J40 Bronchitis, not specified as acute or chronic: Secondary | ICD-10-CM

## 2015-01-29 DIAGNOSIS — Z880 Allergy status to penicillin: Secondary | ICD-10-CM | POA: Diagnosis not present

## 2015-01-29 DIAGNOSIS — I509 Heart failure, unspecified: Secondary | ICD-10-CM | POA: Insufficient documentation

## 2015-01-29 DIAGNOSIS — R0981 Nasal congestion: Secondary | ICD-10-CM

## 2015-01-29 DIAGNOSIS — Z7982 Long term (current) use of aspirin: Secondary | ICD-10-CM | POA: Diagnosis not present

## 2015-01-29 DIAGNOSIS — R05 Cough: Secondary | ICD-10-CM

## 2015-01-29 DIAGNOSIS — R059 Cough, unspecified: Secondary | ICD-10-CM

## 2015-01-29 LAB — CBC
HCT: 40.7 % (ref 36.0–46.0)
Hemoglobin: 13.4 g/dL (ref 12.0–15.0)
MCH: 30.1 pg (ref 26.0–34.0)
MCHC: 32.9 g/dL (ref 30.0–36.0)
MCV: 91.5 fL (ref 78.0–100.0)
PLATELETS: 278 10*3/uL (ref 150–400)
RBC: 4.45 MIL/uL (ref 3.87–5.11)
RDW: 12.9 % (ref 11.5–15.5)
WBC: 12.7 10*3/uL — ABNORMAL HIGH (ref 4.0–10.5)

## 2015-01-29 LAB — BASIC METABOLIC PANEL
Anion gap: 9 (ref 5–15)
BUN: 15 mg/dL (ref 6–20)
CALCIUM: 9.3 mg/dL (ref 8.9–10.3)
CO2: 31 mmol/L (ref 22–32)
Chloride: 99 mmol/L — ABNORMAL LOW (ref 101–111)
Creatinine, Ser: 0.91 mg/dL (ref 0.44–1.00)
GLUCOSE: 164 mg/dL — AB (ref 65–99)
POTASSIUM: 3.8 mmol/L (ref 3.5–5.1)
SODIUM: 139 mmol/L (ref 135–145)

## 2015-01-29 LAB — TROPONIN I: Troponin I: <0.03 ng/mL (ref <0.031)

## 2015-01-29 LAB — BRAIN NATRIURETIC PEPTIDE: B NATRIURETIC PEPTIDE 5: 89.4 pg/mL (ref 0.0–100.0)

## 2015-01-29 MED ORDER — AZITHROMYCIN 250 MG PO TABS: 250.0000 mg | ORAL_TABLET | Freq: Every day | ORAL | Status: DC

## 2015-01-29 MED ORDER — IPRATROPIUM-ALBUTEROL 0.5-2.5 (3) MG/3ML IN SOLN
RESPIRATORY_TRACT | Status: AC
  Administered 2015-01-29: 3 mL
  Filled 2015-01-29: qty 3

## 2015-01-29 MED ORDER — GUAIFENESIN-CODEINE 100-10 MG/5ML PO SYRP: 5.0000 mL | ORAL_SOLUTION | Freq: Three times a day (TID) | ORAL | Status: DC | PRN

## 2015-01-29 MED ORDER — HYDROCOD POLST-CPM POLST ER 10-8 MG/5ML PO SUER: 5.0000 mL | Freq: Two times a day (BID) | ORAL | Status: DC | PRN

## 2015-01-29 MED ORDER — ALBUTEROL SULFATE (2.5 MG/3ML) 0.083% IN NEBU
INHALATION_SOLUTION | RESPIRATORY_TRACT | Status: AC
  Administered 2015-01-29: 2.5 mg
  Filled 2015-01-29: qty 3

## 2015-01-29 NOTE — ED Notes (Addendum)
Nurse first-pt NAD O2 sat 99%-HR 102-RR 20-face mask given

## 2015-01-29 NOTE — ED Notes (Signed)
Patient states she has a one and one half week history of sob and productive cough with yellowish green secretions.  Hx of COPD and CHF.

## 2015-01-29 NOTE — ED Provider Notes (Signed)
CSN: [id number]     Arrival date & time 01/29/15  1445 History   First MD Initiated Contact with Patient 01/29/15 1512     Chief Complaint  Patient presents with  . Shortness of Breath     (Consider location/radiation/quality/duration/timing/severity/associated sxs/prior Treatment) HPI Comments: 39 year old female presents with history of COPD, asthma, diabetes, hypertension and CHF presenting with shortness of breath and cough over the past week and half. Shortness of breath is both at rest and on exertion, states she is having difficulty from walking inside from her car. Cough is productive with yellow and green sputum. Unsure if she has had a fever but states she has been feeling hot and sweaty. Denies chest pain, only reports pain with coughing. Admits to nasal congestion and sinus pressure. States her cardiologist advised her against taking over-the-counter medications. This does not feel like a CHF exacerbation and feels more like her COPD or sinus infection. Cardiologist is at [place] [facility] and also sees cardiology [facility]. Denies extremity edema. Pt given breathing treatment on arrival, and according to RT, pt had fine crackles/few wheezes prior to my exam and treatment. States breathing treatment slightly helped.  Patient is a 39 y.o. female presenting with shortness of breath. The history is provided by the patient.  Shortness of Breath Associated symptoms: cough     Past Medical History  Diagnosis Date  . COPD (chronic obstructive pulmonary disease)   . Asthma   . Diabetes type 2, controlled     significant weight loss  . Obesity     lost 120 pounds with diet and exercise   . Hypertension     improved after weight loss  . UTI (urinary tract infection)   . CHF (congestive heart failure)    Past Surgical History  Procedure Laterality Date  . Cesarean section    . Tubal ligation    . Abdominal hysterectomy    . Cardiac catheterization     No  family history on file. History  Substance Use Topics  . Smoking status: Current Every Day Smoker -- 0.50 packs/day  . Smokeless tobacco: Not on file  . Alcohol Use: No   OB History    No data available     Review of Systems  HENT: Positive for congestion and sinus pressure.   Respiratory: Positive for cough and shortness of breath.   All other systems reviewed and are negative.     Allergies  Penicillins and Codeine  Home Medications   Prior to Admission medications   Medication Sig Start Date End Date Taking? Authorizing Provider  albuterol (PROVENTIL HFA;VENTOLIN HFA) 108 (90 BASE) MCG/ACT inhaler Inhale 2 puffs into the lungs every 6 (six) hours as needed. For shortness of breath   Yes Historical Provider, MD  albuterol (PROVENTIL) (2.5 MG/3ML) 0.083% nebulizer solution Take 2.5 mg by nebulization every 6 (six) hours as needed. 11/24/11 01/29/15 Yes Emeline General, MD  aspirin 81 MG tablet Take 81 mg by mouth daily.   Yes Historical Provider, MD  aspirin-acetaminophen-caffeine (EXCEDRIN MIGRAINE) [phone] MG per tablet Take 2 tablets by mouth every 6 (six) hours as needed. For migraine   Yes Historical Provider, MD  lisinopril (PRINIVIL,ZESTRIL) 5 MG tablet Take 5 mg by mouth daily.   Yes Historical Provider, MD  Magnesium Hydroxide (MAGNESIA PO) Take by mouth.   Yes Historical Provider, MD  metoprolol tartrate (LOPRESSOR) 25 MG tablet Take 25 mg by mouth 2 (two) times daily.   Yes Historical  Provider, MD  Pantoprazole Sodium (PROTONIX PO) Take by mouth.   Yes Historical Provider, MD  potassium chloride (K-DUR) 10 MEQ tablet Take 20 mEq by mouth 2 (two) times daily.   Yes Historical Provider, MD  Rivaroxaban (XARELTO) 15 MG TABS tablet Take 15 mg by mouth daily.   Yes Historical Provider, MD  spironolactone (ALDACTONE) 25 MG tablet Take 25 mg by mouth daily.   Yes Historical Provider, MD  TORSEMIDE PO Take by mouth.   Yes Historical Provider, MD  azithromycin (ZITHROMAX)  250 MG tablet Take 1 tablet (250 mg total) by mouth daily. Take first 2 tablets together, then 1 every day until finished. 01/29/15   Geena Weinhold M Beva Remund, PA-C  carbamazepine (TEGRETOL-XR) 100 MG 12 hr tablet Take 1 tablet (100 mg total) by mouth 2 (two) times daily. 02/24/13   Carleene Cooper, MD  chlorpheniramine-HYDROcodone [facility] ER) 10-8 MG/5ML LQCR Take 5 mLs by mouth every 12 (twelve) hours as needed for cough. 07/24/13   Teressa Lower, NP  chlorpheniramine-HYDROcodone (TUSSIONEX PENNKINETIC ER) 10-8 MG/5ML SUER Take 5 mLs by mouth every 12 (twelve) hours as needed for cough. 01/29/15   Myrla Malanowski M Lavona Norsworthy, PA-C  furosemide (LASIX) 40 MG tablet Take 40 mg by mouth daily.    Historical Provider, MD  glipiZIDE (GLUCOTROL) 5 MG tablet Take by mouth daily before breakfast.    Historical Provider, MD  methocarbamol (ROBAXIN) 500 MG tablet Take 1 tablet (500 mg total) by mouth 2 (two) times daily. 12/20/12   Lorre Nick, MD  metolazone (ZAROXOLYN) 2.5 MG tablet Take 2.5 mg by mouth daily.    Historical Provider, MD  nitrofurantoin, macrocrystal-monohydrate, (MACROBID) 100 MG capsule Take 1 capsule (100 mg total) by mouth 2 (two) times daily. 11/26/12   Elson Areas, PA-C  OVER THE COUNTER MEDICATION Take 30 mLs by mouth daily as needed. For cough/cold symptoms   Cough/cold/severe allergy liquid    Historical Provider, MD  oxyCODONE-acetaminophen (PERCOCET/ROXICET) 5-325 MG per tablet Take 2 tablets by mouth every 4 (four) hours as needed for pain. 12/20/12   Lorre Nick, MD  polyethylene glycol [facility]) packet Take 17 g by mouth daily. 11/26/12   Elson Areas, PA-C  promethazine (PHENERGAN) 25 MG tablet Take 1 tablet (25 mg total) by mouth every 6 (six) hours as needed for nausea. 02/01/13   Quita Skye, MD  sucralfate (CARAFATE) 1 G tablet Take 1 g by mouth 3 (three) times daily.    Historical Provider, MD  traMADol (ULTRAM) 50 MG tablet Take 1 tablet (50 mg total) by mouth every 6 (six) hours as  needed for pain. 02/01/13   Quita Skye, MD   BP 144/68 mmHg  Pulse 110  Temp(Src) 98.3 F (36.8 C) (Oral)  Resp 20  Ht 5' 4.5" (1.638 m)  Wt 200 lb (90.719 kg)  BMI 33.81 kg/m2  SpO2 96% Physical Exam  Constitutional: She is oriented to person, place, and time. She appears well-developed and well-nourished. No distress.  HENT:  Head: Normocephalic and atraumatic.  Mouth/Throat: Oropharynx is clear and moist.  BL frontal and maxillary sinus tenderness. Nasal mucosal edema.  Eyes: Conjunctivae and EOM are normal. Pupils are equal, round, and reactive to light.  Neck: Normal range of motion. Neck supple. No JVD present.  Cardiovascular: Regular rhythm, normal heart sounds and intact distal pulses.   Tachycardic. No extremity edema.  Pulmonary/Chest: Effort normal and breath sounds normal. No respiratory distress.  Harsh cough noted.  Abdominal: Soft. Bowel sounds are normal.  There is no tenderness.  Musculoskeletal: Normal range of motion. She exhibits no edema.  Neurological: She is alert and oriented to person, place, and time. She has normal strength. No sensory deficit.  Speech fluent, goal oriented. Moves limbs without ataxia. Equal grip strength bilateral.  Skin: Skin is warm and dry. She is not diaphoretic.  Psychiatric: She has a normal mood and affect. Her behavior is normal.  Nursing note and vitals reviewed.   ED Course  Procedures (including critical care time) Labs Review Labs Reviewed  CBC - Abnormal; Notable for the following:    WBC 12.7 (*)    All other components within normal limits  BASIC METABOLIC PANEL - Abnormal; Notable for the following:    Chloride 99 (*)    Glucose, Bld 164 (*)    All other components within normal limits  BRAIN NATRIURETIC PEPTIDE  TROPONIN I    Imaging Review Dg Chest 2 View  01/29/2015   CLINICAL DATA:  Shortness of breath.  Cough.  Chest pain.  EXAM: CHEST  2 VIEW  COMPARISON:  08/16/2014  FINDINGS: The heart size and  mediastinal contours are within normal limits. Both lungs are clear. The visualized skeletal structures are unremarkable.  IMPRESSION: No active cardiopulmonary disease.   Electronically Signed   By: Gaylyn Rong M.D.   On: 01/29/2015 16:42     EKG Interpretation   Date/Time:  Wednesday January 29 2015 15:59:31 EDT Ventricular Rate:  115 PR Interval:  148 QRS Duration: 94 QT Interval:  344 QTC Calculation: 475 R Axis:   62 Text Interpretation:  Sinus tachycardia Cannot rule out Anterior infarct ,  age undetermined ST \\T \ T wave abnormality, consider inferolateral  ischemia Abnormal ECG Since previous tracing rate faster Confirmed by  [facility]  MD, MARTHA [id number]) on 01/29/2015 4:14:10 PM      MDM   Final diagnoses:  Cough  Bronchitis  Sinus congestion   Non-toxic appearing, NAD. Afebrile. Tachycardic, vitals otherwise stable. Had received neb prior to my treatment with relief. Sig hx of COPD and CHF. Patient stating this does not feel like COPD or CHF. Also known to have a history of blood clots, patient also states this does not feel the same. Unlikely PE given that she is on Xarelto. Labs showing leukocytosis of 12.7. No other acute findings. Chest x-ray negative. Doubt cardiac. No significant EKG changes. Troponin and BNP normal. No peripheral edema. Given that the patient's symptoms improved after neb treatment, and she has a very harsh cough and sinus congestion, most likely bronchitis with sinus congestion. Will treat with azithromycin and cough meds given her comorbidities. After sitting in the ED waiting for workup, her heart rate is no longer tachycardic. Follow-up with PCP. Stable for discharge. Return precautions given. Patient states understanding of treatment care plan and is agreeable.  Kathrynn Speed, PA-C 01/29/15 1943  Jerelyn Scott, MD 01/29/15 [phone]

## 2015-01-29 NOTE — Discharge Instructions (Signed)
Take azithromycin as prescribed to completion. Take tusisonex for severe cough only. No driving or operating heavy machinery while taking tussionex. This medication may cause drowsiness. Follow up with your primary care doctor.  Cough, Adult  A cough is a reflex that helps clear your throat and airways. It can help heal the body or may be a reaction to an irritated airway. A cough may only last 2 or 3 weeks (acute) or may last more than 8 weeks (chronic).  CAUSES Acute cough:  Viral or bacterial infections. Chronic cough:  Infections.  Allergies.  Asthma.  Post-nasal drip.  Smoking.  Heartburn or acid reflux.  Some medicines.  Chronic lung problems (COPD).  Cancer. SYMPTOMS   Cough.  Fever.  Chest pain.  Increased breathing rate.  High-pitched whistling sound when breathing (wheezing).  Colored mucus that you cough up (sputum). TREATMENT   A bacterial cough may be treated with antibiotic medicine.  A viral cough must run its course and will not respond to antibiotics.  Your caregiver may recommend other treatments if you have a chronic cough. HOME CARE INSTRUCTIONS   Only take over-the-counter or prescription medicines for pain, discomfort, or fever as directed by your caregiver. Use cough suppressants only as directed by your caregiver.  Use a cold steam vaporizer or humidifier in your bedroom or home to help loosen secretions.  Sleep in a semi-upright position if your cough is worse at night.  Rest as needed.  Stop smoking if you smoke. SEEK IMMEDIATE MEDICAL CARE IF:   You have pus in your sputum.  Your cough starts to worsen.  You cannot control your cough with suppressants and are losing sleep.  You begin coughing up blood.  You have difficulty breathing.  You develop pain which is getting worse or is uncontrolled with medicine.  You have a fever. MAKE SURE YOU:   Understand these instructions.  Will watch your condition.  Will get  help right away if you are not doing well or get worse. Document Released: 01/29/2011 Document Revised: 10/25/2011 Document Reviewed: 01/29/2011 [facility] Patient Information 2015 [place], [place]. This information is not intended to replace advice given to you by your health care provider. Make sure you discuss any questions you have with your health care provider. Acute Bronchitis Bronchitis is inflammation of the airways that extend from the windpipe into the lungs (bronchi). The inflammation often causes mucus to develop. This leads to a cough, which is the most common symptom of bronchitis.  In acute bronchitis, the condition usually develops suddenly and goes away over time, usually in a couple weeks. Smoking, allergies, and asthma can make bronchitis worse. Repeated episodes of bronchitis may cause further lung problems.  CAUSES Acute bronchitis is most often caused by the same virus that causes a cold. The virus can spread from person to person (contagious) through coughing, sneezing, and touching contaminated objects. SIGNS AND SYMPTOMS   Cough.   Fever.   Coughing up mucus.   Body aches.   Chest congestion.   Chills.   Shortness of breath.   Sore throat.  DIAGNOSIS  Acute bronchitis is usually diagnosed through a physical exam. Your health care provider will also ask you questions about your medical history. Tests, such as chest X-rays, are sometimes done to rule out other conditions.  TREATMENT  Acute bronchitis usually goes away in a couple weeks. Oftentimes, no medical treatment is necessary. Medicines are sometimes given for relief of fever or cough. Antibiotic medicines are usually not needed  but may be prescribed in certain situations. In some cases, an inhaler may be recommended to help reduce shortness of breath and control the cough. A cool mist vaporizer may also be used to help thin bronchial secretions and make it easier to clear the chest.  HOME CARE  INSTRUCTIONS  Get plenty of rest.   Drink enough fluids to keep your urine clear or pale yellow (unless you have a medical condition that requires fluid restriction). Increasing fluids may help thin your respiratory secretions (sputum) and reduce chest congestion, and it will prevent dehydration.   Take medicines only as directed by your health care provider.  If you were prescribed an antibiotic medicine, finish it all even if you start to feel better.  Avoid smoking and secondhand smoke. Exposure to cigarette smoke or irritating chemicals will make bronchitis worse. If you are a smoker, consider using nicotine gum or skin patches to help control withdrawal symptoms. Quitting smoking will help your lungs heal faster.   Reduce the chances of another bout of acute bronchitis by washing your hands frequently, avoiding people with cold symptoms, and trying not to touch your hands to your mouth, nose, or eyes.   Keep all follow-up visits as directed by your health care provider.  SEEK MEDICAL CARE IF: Your symptoms do not improve after 1 week of treatment.  SEEK IMMEDIATE MEDICAL CARE IF:  You develop an increased fever or chills.   You have chest pain.   You have severe shortness of breath.  You have bloody sputum.   You develop dehydration.  You faint or repeatedly feel like you are going to pass out.  You develop repeated vomiting.  You develop a severe headache. MAKE SURE YOU:   Understand these instructions.  Will watch your condition.  Will get help right away if you are not doing well or get worse. Document Released: 09/09/2004 Document Revised: 12/17/2013 Document Reviewed: 01/23/2013 [facility] Patient Information 2015 [place], [place]. This information is not intended to replace advice given to you by your health care provider. Make sure you discuss any questions you have with your health care provider.

## 2015-04-08 ENCOUNTER — Emergency Department (HOSPITAL_BASED_OUTPATIENT_CLINIC_OR_DEPARTMENT_OTHER)
Admission: EM | Admit: 2015-04-08 | Discharge: 2015-04-08 | Disposition: A | Discharge status: Home / Self Care | Payer: Medicaid Other | Attending: Emergency Medicine | Admitting: Emergency Medicine

## 2015-04-08 ENCOUNTER — Emergency Department (HOSPITAL_BASED_OUTPATIENT_CLINIC_OR_DEPARTMENT_OTHER): Payer: Medicaid Other

## 2015-04-08 ENCOUNTER — Encounter (HOSPITAL_BASED_OUTPATIENT_CLINIC_OR_DEPARTMENT_OTHER): Payer: Self-pay

## 2015-04-08 DIAGNOSIS — Z9889 Other specified postprocedural states: Secondary | ICD-10-CM | POA: Insufficient documentation

## 2015-04-08 DIAGNOSIS — R0789 Other chest pain: Secondary | ICD-10-CM | POA: Insufficient documentation

## 2015-04-08 DIAGNOSIS — E669 Obesity, unspecified: Secondary | ICD-10-CM | POA: Insufficient documentation

## 2015-04-08 DIAGNOSIS — I509 Heart failure, unspecified: Secondary | ICD-10-CM | POA: Insufficient documentation

## 2015-04-08 DIAGNOSIS — Z720 Tobacco use: Secondary | ICD-10-CM | POA: Diagnosis not present

## 2015-04-08 DIAGNOSIS — Z79899 Other long term (current) drug therapy: Secondary | ICD-10-CM | POA: Diagnosis not present

## 2015-04-08 DIAGNOSIS — R531 Weakness: Secondary | ICD-10-CM | POA: Insufficient documentation

## 2015-04-08 DIAGNOSIS — Z87440 Personal history of urinary (tract) infections: Secondary | ICD-10-CM | POA: Diagnosis not present

## 2015-04-08 DIAGNOSIS — Z880 Allergy status to penicillin: Secondary | ICD-10-CM | POA: Insufficient documentation

## 2015-04-08 DIAGNOSIS — Z7982 Long term (current) use of aspirin: Secondary | ICD-10-CM | POA: Diagnosis not present

## 2015-04-08 DIAGNOSIS — J441 Chronic obstructive pulmonary disease with (acute) exacerbation: Secondary | ICD-10-CM | POA: Insufficient documentation

## 2015-04-08 DIAGNOSIS — I10 Essential (primary) hypertension: Secondary | ICD-10-CM | POA: Insufficient documentation

## 2015-04-08 DIAGNOSIS — E119 Type 2 diabetes mellitus without complications: Secondary | ICD-10-CM | POA: Insufficient documentation

## 2015-04-08 DIAGNOSIS — R079 Chest pain, unspecified: Secondary | ICD-10-CM | POA: Diagnosis present

## 2015-04-08 LAB — URINALYSIS, ROUTINE W REFLEX MICROSCOPIC
BILIRUBIN URINE: NEGATIVE
Glucose, UA: NEGATIVE mg/dL
KETONES UR: NEGATIVE mg/dL
Leukocytes, UA: NEGATIVE
NITRITE: NEGATIVE
Protein, ur: NEGATIVE mg/dL
SPECIFIC GRAVITY, URINE: 1.01 (ref 1.005–1.030)
UROBILINOGEN UA: 0.2 mg/dL (ref 0.0–1.0)
pH: 5 (ref 5.0–8.0)

## 2015-04-08 LAB — BASIC METABOLIC PANEL
ANION GAP: 12 (ref 5–15)
BUN: 18 mg/dL (ref 6–20)
CALCIUM: 9.4 mg/dL (ref 8.9–10.3)
CO2: 28 mmol/L (ref 22–32)
CREATININE: 1.11 mg/dL — AB (ref 0.44–1.00)
Chloride: 97 mmol/L — ABNORMAL LOW (ref 101–111)
Glucose, Bld: 96 mg/dL (ref 65–99)
Potassium: 3.5 mmol/L (ref 3.5–5.1)
SODIUM: 137 mmol/L (ref 135–145)

## 2015-04-08 LAB — CBC
HCT: 42.1 % (ref 36.0–46.0)
HEMOGLOBIN: 14.1 g/dL (ref 12.0–15.0)
MCH: 30.2 pg (ref 26.0–34.0)
MCHC: 33.5 g/dL (ref 30.0–36.0)
MCV: 90.1 fL (ref 78.0–100.0)
PLATELETS: 280 10*3/uL (ref 150–400)
RBC: 4.67 MIL/uL (ref 3.87–5.11)
RDW: 12.5 % (ref 11.5–15.5)
WBC: 13.5 10*3/uL — AB (ref 4.0–10.5)

## 2015-04-08 LAB — URINE MICROSCOPIC-ADD ON

## 2015-04-08 LAB — TROPONIN I: TROPONIN I: 0.11 ng/mL — AB (ref <0.031)

## 2015-04-08 LAB — BRAIN NATRIURETIC PEPTIDE: B NATRIURETIC PEPTIDE 5: 93.1 pg/mL (ref 0.0–100.0)

## 2015-04-08 MED ORDER — PROMETHAZINE HCL 25 MG/ML IJ SOLN
25.0000 mg | Freq: Once | INTRAMUSCULAR | Status: AC
  Administered 2015-04-08: 25 mg via INTRAVENOUS
  Filled 2015-04-08: qty 1

## 2015-04-08 MED ORDER — ALBUTEROL SULFATE (2.5 MG/3ML) 0.083% IN NEBU: 2.5000 mg | INHALATION_SOLUTION | RESPIRATORY_TRACT | Status: AC | PRN

## 2015-04-08 MED ORDER — ONDANSETRON HCL 4 MG/2ML IJ SOLN
4.0000 mg | Freq: Once | INTRAMUSCULAR | Status: AC
  Administered 2015-04-08: 4 mg via INTRAVENOUS
  Filled 2015-04-08: qty 2

## 2015-04-08 MED ORDER — PREDNISONE 20 MG PO TABS
40.0000 mg | ORAL_TABLET | Freq: Every day | ORAL | Status: DC
Start: 2015-04-08 — End: 2015-06-12

## 2015-04-08 MED ORDER — ALBUTEROL SULFATE HFA 108 (90 BASE) MCG/ACT IN AERS
2.0000 | INHALATION_SPRAY | RESPIRATORY_TRACT | Status: DC | PRN
  Administered 2015-04-08: 2 via RESPIRATORY_TRACT
  Filled 2015-04-08: qty 6.7

## 2015-04-08 MED ORDER — MORPHINE SULFATE (PF) 4 MG/ML IV SOLN
4.0000 mg | Freq: Once | INTRAVENOUS | Status: AC
  Administered 2015-04-08: 4 mg via INTRAVENOUS
  Filled 2015-04-08: qty 1

## 2015-04-08 NOTE — ED Provider Notes (Signed)
CSN: [id number]     Arrival date & time 04/08/15  2029 History  This chart was scribed for Gilda Crease, MD by Octavia Heir, ED Scribe. This patient was seen in room MH04/MH04 and the patient's care was started at 8:46 PM.    Chief Complaint  Patient presents with  . Chest Pain      The history is provided by the patient. No language interpreter was used.   HPI Comments: Sonya Pennington is a 39 y.o. female who has a hx of COPD, PE, CHF, and HTN presents to the Emergency Department complaining of intermittent, gradual worsening, centralized left sided chest pan onset 5 days ago. She reports having associated weakness, shortness of breath and headache. Pt notes the pain started when she got back on her oxygen five days ago. She states any movement makes the pain worse. She denies any abdominal pain and cough.  Past Medical History  Diagnosis Date  . COPD (chronic obstructive pulmonary disease)   . Asthma   . Diabetes type 2, controlled     significant weight loss  . Obesity     lost 120 pounds with diet and exercise   . Hypertension     improved after weight loss  . UTI (urinary tract infection)   . CHF (congestive heart failure)    Past Surgical History  Procedure Laterality Date  . Cesarean section    . Tubal ligation    . Abdominal hysterectomy    . Cardiac catheterization     History reviewed. No pertinent family history. Social History  Substance Use Topics  . Smoking status: Current Every Day Smoker -- 0.50 packs/day  . Smokeless tobacco: None  . Alcohol Use: No   OB History    No data available     Review of Systems  Respiratory: Positive for shortness of breath. Negative for cough.   Cardiovascular: Positive for chest pain.  Gastrointestinal: Negative for abdominal pain.  Neurological: Positive for weakness.  All other systems reviewed and are negative.     Allergies  Penicillins and Codeine  Home Medications   Prior to Admission  medications   Medication Sig Start Date End Date Taking? Authorizing Provider  albuterol (PROVENTIL HFA;VENTOLIN HFA) 108 (90 BASE) MCG/ACT inhaler Inhale 2 puffs into the lungs every 6 (six) hours as needed. For shortness of breath   Yes Historical Provider, MD  albuterol (PROVENTIL) (2.5 MG/3ML) 0.083% nebulizer solution Take 2.5 mg by nebulization every 6 (six) hours as needed. 11/24/11 04/08/15 Yes Emeline General, MD  aspirin 81 MG tablet Take 81 mg by mouth daily.   Yes Historical Provider, MD  aspirin-acetaminophen-caffeine (EXCEDRIN MIGRAINE) [phone] MG per tablet Take 2 tablets by mouth every 6 (six) hours as needed. For migraine   Yes Historical Provider, MD  lisinopril (PRINIVIL,ZESTRIL) 5 MG tablet Take 5 mg by mouth daily.   Yes Historical Provider, MD  Magnesium Hydroxide (MAGNESIA PO) Take by mouth.   Yes Historical Provider, MD  metoprolol tartrate (LOPRESSOR) 25 MG tablet Take 25 mg by mouth 2 (two) times daily.   Yes Historical Provider, MD  OVER THE COUNTER MEDICATION Take 30 mLs by mouth daily as needed. For cough/cold symptoms   Cough/cold/severe allergy liquid   Yes Historical Provider, MD  Pantoprazole Sodium (PROTONIX PO) Take by mouth.   Yes Historical Provider, MD  polyethylene glycol (MIRALAX) packet Take 17 g by mouth daily. 11/26/12  Yes Lonia Skinner Sofia, PA-C  potassium chloride (K-DUR) 10 MEQ tablet  Take 20 mEq by mouth 2 (two) times daily.   Yes Historical Provider, MD  Rivaroxaban (XARELTO) 15 MG TABS tablet Take 15 mg by mouth daily.   Yes Historical Provider, MD  spironolactone (ALDACTONE) 25 MG tablet Take 25 mg by mouth daily.   Yes Historical Provider, MD  TORSEMIDE PO Take by mouth.   Yes Historical Provider, MD  azithromycin (ZITHROMAX) 250 MG tablet Take 1 tablet (250 mg total) by mouth daily. Take first 2 tablets together, then 1 every day until finished. 01/29/15   Robyn M Hess, PA-C  carbamazepine (TEGRETOL-XR) 100 MG 12 hr tablet Take 1 tablet (100 mg total)  by mouth 2 (two) times daily. 02/24/13   Carleene Cooper, MD  chlorpheniramine-HYDROcodone [facility] ER) 10-8 MG/5ML LQCR Take 5 mLs by mouth every 12 (twelve) hours as needed for cough. 07/24/13   Teressa Lower, NP  chlorpheniramine-HYDROcodone (TUSSIONEX PENNKINETIC ER) 10-8 MG/5ML SUER Take 5 mLs by mouth every 12 (twelve) hours as needed for cough. 01/29/15   Robyn M Hess, PA-C  furosemide (LASIX) 40 MG tablet Take 40 mg by mouth daily.    Historical Provider, MD  glipiZIDE (GLUCOTROL) 5 MG tablet Take by mouth daily before breakfast.    Historical Provider, MD  guaiFENesin-codeine (CHERATUSSIN AC) 100-10 MG/5ML syrup Take 5 mLs by mouth 3 (three) times daily as needed for cough. 01/29/15   Robyn M Hess, PA-C  methocarbamol (ROBAXIN) 500 MG tablet Take 1 tablet (500 mg total) by mouth 2 (two) times daily. 12/20/12   Lorre Nick, MD  metolazone (ZAROXOLYN) 2.5 MG tablet Take 2.5 mg by mouth daily.    Historical Provider, MD  nitrofurantoin, macrocrystal-monohydrate, (MACROBID) 100 MG capsule Take 1 capsule (100 mg total) by mouth 2 (two) times daily. 11/26/12   Elson Areas, PA-C  oxyCODONE-acetaminophen (PERCOCET/ROXICET) 5-325 MG per tablet Take 2 tablets by mouth every 4 (four) hours as needed for pain. 12/20/12   Lorre Nick, MD  promethazine (PHENERGAN) 25 MG tablet Take 1 tablet (25 mg total) by mouth every 6 (six) hours as needed for nausea. 02/01/13   Quita Skye, MD  sucralfate (CARAFATE) 1 G tablet Take 1 g by mouth 3 (three) times daily.    Historical Provider, MD  traMADol (ULTRAM) 50 MG tablet Take 1 tablet (50 mg total) by mouth every 6 (six) hours as needed for pain. 02/01/13   Quita Skye, MD   Triage vitals: BP 121/70 mmHg  Pulse 105  Temp(Src) 98.6 F (37 C) (Oral)  Resp 16  Ht 5\' 4"  (1.626 m)  SpO2 100% Physical Exam  Constitutional: She is oriented to person, place, and time. She appears well-developed and well-nourished. No distress.  HENT:  Head:  Normocephalic and atraumatic.  Right Ear: Hearing normal.  Left Ear: Hearing normal.  Nose: Nose normal.  Mouth/Throat: Oropharynx is clear and moist and mucous membranes are normal.  Eyes: Conjunctivae and EOM are normal. Pupils are equal, round, and reactive to light.  Neck: Normal range of motion. Neck supple.  Cardiovascular: Regular rhythm, S1 normal and S2 normal.  Exam reveals no gallop and no friction rub.   No murmur heard. Pulmonary/Chest: Effort normal and breath sounds normal. No respiratory distress. She exhibits tenderness.  Very reproducible tenderness, left chest  Abdominal: Soft. Normal appearance and bowel sounds are normal. There is no hepatosplenomegaly. There is no tenderness. There is no rebound, no guarding, no tenderness at McBurney's point and negative Murphy's sign. No hernia.  Musculoskeletal: Normal range of  motion.  Neurological: She is alert and oriented to person, place, and time. She has normal strength. No cranial nerve deficit or sensory deficit. Coordination normal. GCS eye subscore is 4. GCS verbal subscore is 5. GCS motor subscore is 6.  Skin: Skin is warm, dry and intact. No rash noted. No cyanosis.  Psychiatric: She has a normal mood and affect. Her speech is normal and behavior is normal. Thought content normal.  Nursing note and vitals reviewed.   ED Course  Procedures  DIAGNOSTIC STUDIES: Oxygen Saturation is 100% on RA, normal by my interpretation.  COORDINATION OF CARE:  8:50 PM Discussed treatment plan which includes lab work and imaging with pt at bedside and pt agreed to plan.  Labs Review Labs Reviewed  BASIC METABOLIC PANEL - Abnormal; Notable for the following:    Chloride 97 (*)    Creatinine, Ser 1.11 (*)    All other components within normal limits  CBC - Abnormal; Notable for the following:    WBC 13.5 (*)    All other components within normal limits  TROPONIN I - Abnormal; Notable for the following:    Troponin I 0.11 (*)     All other components within normal limits  URINALYSIS, ROUTINE W REFLEX MICROSCOPIC (NOT AT [facility]) - Abnormal; Notable for the following:    APPearance CLOUDY (*)    Hgb urine dipstick TRACE (*)    All other components within normal limits  URINE MICROSCOPIC-ADD ON - Abnormal; Notable for the following:    Squamous Epithelial / LPF MANY (*)    All other components within normal limits  BRAIN NATRIURETIC PEPTIDE  TROPONIN I    Imaging Review Dg Chest 2 View  04/08/2015   CLINICAL DATA:  Mid chest to left-sided back pain for 2 days. History of COPD, CHF, hypertension, asthma, diabetes, smoker, cardiac catheterization.  EXAM: CHEST  2 VIEW  COMPARISON:  01/29/2015  FINDINGS: Normal heart size and pulmonary vascularity. No focal airspace disease or consolidation in the lungs. No blunting of costophrenic angles. No pneumothorax. Mediastinal contours appear intact. Old left rib fracture.  IMPRESSION: No active cardiopulmonary disease.   Electronically Signed   By: Burman Nieves M.D.   On: 04/08/2015 21:15   I have personally reviewed and evaluated these images and lab results as part of my medical decision-making.   EKG Interpretation   Date/Time:  Tuesday April 08 2015 20:37:57 EDT Ventricular Rate:  102 PR Interval:  154 QRS Duration: 94 QT Interval:  336 QTC Calculation: 437 R Axis:   78 Text Interpretation:  Sinus tachycardia T wave abnormality, consider  inferolateral ischemia Abnormal ECG No significant change since last  tracing Confirmed by Kalmen Lollar  MD, Orville Widmann [phone]) on 04/08/2015 8:44:08  PM      MDM   Final diagnoses:  None   chest wall pain  Patient presents to the emergency department for evaluation of chest pain. Patient has been experiencing symptoms for 4 days. Patient does report that the pain is sharp and it worsens with movement. She has exquisite tenderness over the left side of her chest wall. This reproduces her pain. She also reports that she cannot  lie on that side, bend or twist because of worsening pain. This is very consistent with musculoskeletal chest pain.  EKG is unchanged from previous EKG. She reports previous history of congestive heart failure, but patient does not have any current signs of active or decompensated congestive heart failure. Patient does report a history of PE  but she is on Xarelto currently. Doubt PE.  Patient also complaining of headache. She reports a history of chronic headache. This headache is no different than previous, no concern for intracranial pathology. She normally gets "Dilaudid and Phenergan". Patient already given morphine for her headache and chest pain, will add Phenergan IV before discharge.  Patient does have a history of chronic lung disease. She is out of her albuterol. Will prescribe. Also given an inhaler. Short course of prednisone.  I personally performed the services described in this documentation, which was scribed in my presence. The recorded information has been reviewed and is accurate.    Gilda Crease, MD 04/08/15 [phone]

## 2015-04-08 NOTE — ED Notes (Signed)
Pt reports centralized, left sided chest pressure x4 days, worse with inspiration. Also reports weakness, shortness of breath, CHF and PE history.

## 2015-04-08 NOTE — Discharge Instructions (Signed)

## 2015-04-08 NOTE — ED Notes (Signed)
Patient transported to X-ray 

## 2015-04-08 NOTE — ED Notes (Signed)
Pt placed on heart monitor.

## 2015-06-12 ENCOUNTER — Emergency Department (HOSPITAL_BASED_OUTPATIENT_CLINIC_OR_DEPARTMENT_OTHER)
Admission: EM | Admit: 2015-06-12 | Discharge: 2015-06-12 | Disposition: A | Discharge status: Home / Self Care | Payer: Medicaid Other | Attending: Emergency Medicine | Admitting: Emergency Medicine

## 2015-06-12 ENCOUNTER — Encounter (HOSPITAL_BASED_OUTPATIENT_CLINIC_OR_DEPARTMENT_OTHER): Payer: Self-pay

## 2015-06-12 ENCOUNTER — Emergency Department (HOSPITAL_BASED_OUTPATIENT_CLINIC_OR_DEPARTMENT_OTHER): Payer: Medicaid Other

## 2015-06-12 DIAGNOSIS — E669 Obesity, unspecified: Secondary | ICD-10-CM | POA: Insufficient documentation

## 2015-06-12 DIAGNOSIS — I10 Essential (primary) hypertension: Secondary | ICD-10-CM | POA: Insufficient documentation

## 2015-06-12 DIAGNOSIS — E119 Type 2 diabetes mellitus without complications: Secondary | ICD-10-CM | POA: Insufficient documentation

## 2015-06-12 DIAGNOSIS — J441 Chronic obstructive pulmonary disease with (acute) exacerbation: Secondary | ICD-10-CM | POA: Diagnosis not present

## 2015-06-12 DIAGNOSIS — Z79899 Other long term (current) drug therapy: Secondary | ICD-10-CM | POA: Diagnosis not present

## 2015-06-12 DIAGNOSIS — Z720 Tobacco use: Secondary | ICD-10-CM | POA: Diagnosis not present

## 2015-06-12 DIAGNOSIS — Z87440 Personal history of urinary (tract) infections: Secondary | ICD-10-CM | POA: Insufficient documentation

## 2015-06-12 DIAGNOSIS — I509 Heart failure, unspecified: Secondary | ICD-10-CM | POA: Insufficient documentation

## 2015-06-12 DIAGNOSIS — Z9889 Other specified postprocedural states: Secondary | ICD-10-CM | POA: Diagnosis not present

## 2015-06-12 DIAGNOSIS — Z880 Allergy status to penicillin: Secondary | ICD-10-CM | POA: Diagnosis not present

## 2015-06-12 DIAGNOSIS — Z7982 Long term (current) use of aspirin: Secondary | ICD-10-CM | POA: Insufficient documentation

## 2015-06-12 DIAGNOSIS — J40 Bronchitis, not specified as acute or chronic: Secondary | ICD-10-CM

## 2015-06-12 DIAGNOSIS — Z7952 Long term (current) use of systemic steroids: Secondary | ICD-10-CM | POA: Insufficient documentation

## 2015-06-12 DIAGNOSIS — R05 Cough: Secondary | ICD-10-CM | POA: Diagnosis present

## 2015-06-12 LAB — CBC WITH DIFFERENTIAL/PLATELET
Basophils Absolute: 0.1 10*3/uL (ref 0.0–0.1)
Basophils Relative: 0 %
Eosinophils Absolute: 0.8 10*3/uL — ABNORMAL HIGH (ref 0.0–0.7)
Eosinophils Relative: 7 %
HCT: 42.5 % (ref 36.0–46.0)
Hemoglobin: 14.2 g/dL (ref 12.0–15.0)
Lymphocytes Relative: 17 %
Lymphs Abs: 2.1 10*3/uL (ref 0.7–4.0)
MCH: 29.6 pg (ref 26.0–34.0)
MCHC: 33.4 g/dL (ref 30.0–36.0)
MCV: 88.5 fL (ref 78.0–100.0)
Monocytes Absolute: 1.1 10*3/uL — ABNORMAL HIGH (ref 0.1–1.0)
Monocytes Relative: 9 %
Neutro Abs: 8.1 10*3/uL — ABNORMAL HIGH (ref 1.7–7.7)
Neutrophils Relative %: 67 %
Platelets: 359 10*3/uL (ref 150–400)
RBC: 4.8 MIL/uL (ref 3.87–5.11)
RDW: 12.7 % (ref 11.5–15.5)
WBC: 12.2 10*3/uL — ABNORMAL HIGH (ref 4.0–10.5)

## 2015-06-12 LAB — BASIC METABOLIC PANEL
Anion gap: 10 (ref 5–15)
BUN: 15 mg/dL (ref 6–20)
CO2: 31 mmol/L (ref 22–32)
Calcium: 9.4 mg/dL (ref 8.9–10.3)
Chloride: 96 mmol/L — ABNORMAL LOW (ref 101–111)
Creatinine, Ser: 0.8 mg/dL (ref 0.44–1.00)
GFR calc Af Amer: >60 mL/min (ref >60)
GFR calc non Af Amer: >60 mL/min (ref >60)
Glucose, Bld: 107 mg/dL — ABNORMAL HIGH (ref 65–99)
Potassium: 3.5 mmol/L (ref 3.5–5.1)
Sodium: 137 mmol/L (ref 135–145)

## 2015-06-12 LAB — BRAIN NATRIURETIC PEPTIDE: B Natriuretic Peptide: 29.8 pg/mL (ref 0.0–100.0)

## 2015-06-12 MED ORDER — PROMETHAZINE-DM 6.25-15 MG/5ML PO SYRP: 5.0000 mL | ORAL_SOLUTION | Freq: Four times a day (QID) | ORAL | Status: DC | PRN

## 2015-06-12 MED ORDER — PREDNISONE 50 MG PO TABS: 50.0000 mg | ORAL_TABLET | Freq: Every day | ORAL | Status: DC

## 2015-06-12 MED ORDER — DOXYCYCLINE HYCLATE 100 MG PO CAPS: 100.0000 mg | ORAL_CAPSULE | Freq: Two times a day (BID) | ORAL | Status: DC

## 2015-06-12 NOTE — ED Provider Notes (Signed)
CSN: [id number]     Arrival date & time 06/12/15  1538 History   First MD Initiated Contact with Patient 06/12/15 1553     Chief Complaint  Patient presents with  . Cough     (Consider location/radiation/quality/duration/timing/severity/associated sxs/prior Treatment) HPI Patient presents to the emergency department with cough and shortness of breath 3 weeks.  Patient states that this been ongoing for 3 weeks.  She is taken some over-the-counter cough and cold medications.  She states that did not seem to help.  The patient uses albuterol at home.  She states that she has not have any chest pain, nausea, vomiting, weakness, dizziness, headache, blurred vision, back pain, neck pain, fever, runny nose, sore throat, incontinence, rash, lightheadedness or syncope.  He should states that nothing seems make her condition better or worse except that she is on 2 L of home oxygen Past Medical History  Diagnosis Date  . COPD (chronic obstructive pulmonary disease) (HCC)   . Asthma   . Diabetes type 2, controlled (HCC)     significant weight loss  . Obesity     lost 120 pounds with diet and exercise   . Hypertension     improved after weight loss  . UTI (urinary tract infection)   . CHF (congestive heart failure) [facility](HCC)    Past Surgical History  Procedure Laterality Date  . Cesarean section    . Tubal ligation    . Abdominal hysterectomy    . Cardiac catheterization     No family history on file. Social History  Substance Use Topics  . Smoking status: Current Every Day Smoker -- 0.50 packs/day  . Smokeless tobacco: None  . Alcohol Use: No   OB History    No data available     Review of Systems All other systems negative except as documented in the HPI. All pertinent positives and negatives as reviewed in the HPI.   Allergies  Penicillins  Home Medications   Prior to Admission medications   Medication Sig Start Date End Date Taking? Authorizing Provider  albuterol  (PROVENTIL HFA;VENTOLIN HFA) 108 (90 BASE) MCG/ACT inhaler Inhale 2 puffs into the lungs every 6 (six) hours as needed. For shortness of breath    Historical Provider, MD  albuterol (PROVENTIL) (2.5 MG/3ML) 0.083% nebulizer solution Take 2.5 mg by nebulization every 6 (six) hours as needed. 11/24/11 04/08/15  Emeline GeneralKathleen Hosmer, MD  albuterol (PROVENTIL) (2.5 MG/3ML) 0.083% nebulizer solution Take 3 mLs (2.5 mg total) by nebulization every 4 (four) hours as needed for wheezing or shortness of breath. 04/08/15   Gilda Creasehristopher J Pollina, MD  aspirin 81 MG tablet Take 81 mg by mouth daily.    Historical Provider, MD  aspirin-acetaminophen-caffeine (EXCEDRIN MIGRAINE) [phone] MG per tablet Take 2 tablets by mouth every 6 (six) hours as needed. For migraine    Historical Provider, MD  azithromycin (ZITHROMAX) 250 MG tablet Take 1 tablet (250 mg total) by mouth daily. Take first 2 tablets together, then 1 every day until finished. 01/29/15   Robyn M Hess, PA-C  carbamazepine (TEGRETOL-XR) 100 MG 12 hr tablet Take 1 tablet (100 mg total) by mouth 2 (two) times daily. 02/24/13   Carleene CooperAlan Davidson, MD  chlorpheniramine-HYDROcodone [facility](TUSSIONEX PENNKINETIC ER) 10-8 MG/5ML LQCR Take 5 mLs by mouth every 12 (twelve) hours as needed for cough. 07/24/13   Teressa LowerVrinda Pickering, NP  chlorpheniramine-HYDROcodone (TUSSIONEX PENNKINETIC ER) 10-8 MG/5ML SUER Take 5 mLs by mouth every 12 (twelve) hours as needed for cough. 01/29/15  Robyn M Hess, PA-C  furosemide (LASIX) 40 MG tablet Take 40 mg by mouth daily.    Historical Provider, MD  glipiZIDE (GLUCOTROL) 5 MG tablet Take by mouth daily before breakfast.    Historical Provider, MD  guaiFENesin-codeine (CHERATUSSIN AC) 100-10 MG/5ML syrup Take 5 mLs by mouth 3 (three) times daily as needed for cough. 01/29/15   Robyn M Hess, PA-C  lisinopril (PRINIVIL,ZESTRIL) 5 MG tablet Take 5 mg by mouth daily.    Historical Provider, MD  Magnesium Hydroxide (MAGNESIA PO) Take by mouth.    Historical  Provider, MD  methocarbamol (ROBAXIN) 500 MG tablet Take 1 tablet (500 mg total) by mouth 2 (two) times daily. 12/20/12   Lorre Nick, MD  metolazone (ZAROXOLYN) 2.5 MG tablet Take 2.5 mg by mouth daily.    Historical Provider, MD  metoprolol tartrate (LOPRESSOR) 25 MG tablet Take 25 mg by mouth 2 (two) times daily.    Historical Provider, MD  nitrofurantoin, macrocrystal-monohydrate, (MACROBID) 100 MG capsule Take 1 capsule (100 mg total) by mouth 2 (two) times daily. 11/26/12   Elson Areas, PA-C  OVER THE COUNTER MEDICATION Take 30 mLs by mouth daily as needed. For cough/cold symptoms   Cough/cold/severe allergy liquid    Historical Provider, MD  oxyCODONE-acetaminophen (PERCOCET/ROXICET) 5-325 MG per tablet Take 2 tablets by mouth every 4 (four) hours as needed for pain. 12/20/12   Lorre Nick, MD  Pantoprazole Sodium (PROTONIX PO) Take by mouth.    Historical Provider, MD  polyethylene glycol (MIRALAX) packet Take 17 g by mouth daily. 11/26/12   Elson Areas, PA-C  potassium chloride (K-DUR) 10 MEQ tablet Take 20 mEq by mouth 2 (two) times daily.    Historical Provider, MD  predniSONE (DELTASONE) 20 MG tablet Take 2 tablets (40 mg total) by mouth daily with breakfast. 04/08/15   Gilda Crease, MD  promethazine (PHENERGAN) 25 MG tablet Take 1 tablet (25 mg total) by mouth every 6 (six) hours as needed for nausea. 02/01/13   Quita Skye, MD  Rivaroxaban (XARELTO) 15 MG TABS tablet Take 15 mg by mouth daily.    Historical Provider, MD  spironolactone (ALDACTONE) 25 MG tablet Take 25 mg by mouth daily.    Historical Provider, MD  sucralfate (CARAFATE) 1 G tablet Take 1 g by mouth 3 (three) times daily.    Historical Provider, MD  TORSEMIDE PO Take by mouth.    Historical Provider, MD  traMADol (ULTRAM) 50 MG tablet Take 1 tablet (50 mg total) by mouth every 6 (six) hours as needed for pain. 02/01/13   Quita Skye, MD   BP 116/57 mmHg  Pulse 76  Temp(Src) 99.6 F (37.6 C) (Oral)  Resp  18  Ht  (1.626 m)  Wt 200 lb 9.6 oz (90.992 kg)  BMI 34.42 kg/m2  SpO2 100% Physical Exam  Constitutional: She is oriented to person, place, and time. She appears well-developed and well-nourished. No distress.  HENT:  Head: Normocephalic and atraumatic.  Mouth/Throat: Oropharynx is clear and moist.  Eyes: Pupils are equal, round, and reactive to light.  Neck: Normal range of motion. Neck supple.  Cardiovascular: Normal rate, regular rhythm and normal heart sounds.  Exam reveals no gallop and no friction rub.   No murmur heard. Pulmonary/Chest: Effort normal and breath sounds normal. No respiratory distress. She has no wheezes.  Abdominal: Soft. Bowel sounds are normal. She exhibits no distension. There is no tenderness.  Musculoskeletal: She exhibits no edema.  Neurological: She  is alert and oriented to person, place, and time. She exhibits normal muscle tone. Coordination normal.  Skin: Skin is warm and dry. No rash noted. No erythema.  Psychiatric: She has a normal mood and affect. Her behavior is normal.  Nursing note and vitals reviewed.   ED Course  Procedures (including critical care time) Labs Review Labs Reviewed  BASIC METABOLIC PANEL - Abnormal; Notable for the following:    Chloride 96 (*)    Glucose, Bld 107 (*)    All other components within normal limits  CBC WITH DIFFERENTIAL/PLATELET - Abnormal; Notable for the following:    WBC 12.2 (*)    Neutro Abs 8.1 (*)    Monocytes Absolute 1.1 (*)    Eosinophils Absolute 0.8 (*)    All other components within normal limits  BRAIN NATRIURETIC PEPTIDE    Imaging Review Dg Chest 2 View  06/12/2015  CLINICAL DATA:  Three weeks of cough and chest congestion ; history of CHF, diabetes, COPD-asthma. EXAM: CHEST  2 VIEW COMPARISON:  PA and lateral chest x-ray of April 08, 2015 and June fifteenth 2016 FINDINGS: The lungs are well-expanded. There is chronic blunting of the right lateral and posterior costophrenic  angles. There is no alveolar infiltrate. The heart and pulmonary vascularity are normal. The mediastinum is normal in width. The bony thorax exhibits no acute abnormality. IMPRESSION: There is no evidence of pneumonia, CHF, nor other acute cardiopulmonary disease. Chronic blunting of the costophrenic angles on the right likely reflects pleural fluid or pleural thickening. Electronically Signed   By: David  [place] M.D.   On: 06/12/2015 16:45   I have personally reviewed and evaluated these images and lab results as part of my medical decision-making.  She will be given a course of antibiotics and advised return here as needed.  Told to increase her fluid intake, rest as much possible. The patient does not have any wheezing on exam. The patient has no signs of CHF at this time.   Charlestine Night, PA-C 06/12/15 1904  Laurence Spates, MD 06/13/15 1447

## 2015-06-12 NOTE — ED Notes (Signed)
C/o prod cough, SOB x 3 weeks-pt is on home O2 [id number] 24/7

## 2015-06-12 NOTE — Discharge Instructions (Signed)
Return here as needed.  Follow-up with your primary care doctor, rest as much possible

## 2015-11-30 ENCOUNTER — Emergency Department (HOSPITAL_BASED_OUTPATIENT_CLINIC_OR_DEPARTMENT_OTHER)
Admission: EM | Admit: 2015-11-30 | Discharge: 2015-11-30 | Disposition: A | Discharge status: Home / Self Care | Payer: Medicaid Other | Attending: Emergency Medicine | Admitting: Emergency Medicine

## 2015-11-30 ENCOUNTER — Encounter (HOSPITAL_BASED_OUTPATIENT_CLINIC_OR_DEPARTMENT_OTHER): Payer: Self-pay | Admitting: Emergency Medicine

## 2015-11-30 DIAGNOSIS — R42 Dizziness and giddiness: Secondary | ICD-10-CM | POA: Insufficient documentation

## 2015-11-30 DIAGNOSIS — Z880 Allergy status to penicillin: Secondary | ICD-10-CM | POA: Insufficient documentation

## 2015-11-30 DIAGNOSIS — Z7982 Long term (current) use of aspirin: Secondary | ICD-10-CM | POA: Diagnosis not present

## 2015-11-30 DIAGNOSIS — Z79899 Other long term (current) drug therapy: Secondary | ICD-10-CM | POA: Insufficient documentation

## 2015-11-30 DIAGNOSIS — R51 Headache: Secondary | ICD-10-CM | POA: Insufficient documentation

## 2015-11-30 DIAGNOSIS — E669 Obesity, unspecified: Secondary | ICD-10-CM | POA: Insufficient documentation

## 2015-11-30 DIAGNOSIS — I10 Essential (primary) hypertension: Secondary | ICD-10-CM | POA: Insufficient documentation

## 2015-11-30 DIAGNOSIS — Z7952 Long term (current) use of systemic steroids: Secondary | ICD-10-CM | POA: Diagnosis not present

## 2015-11-30 DIAGNOSIS — Z87440 Personal history of urinary (tract) infections: Secondary | ICD-10-CM | POA: Insufficient documentation

## 2015-11-30 DIAGNOSIS — J449 Chronic obstructive pulmonary disease, unspecified: Secondary | ICD-10-CM | POA: Insufficient documentation

## 2015-11-30 DIAGNOSIS — Z792 Long term (current) use of antibiotics: Secondary | ICD-10-CM | POA: Insufficient documentation

## 2015-11-30 DIAGNOSIS — F17200 Nicotine dependence, unspecified, uncomplicated: Secondary | ICD-10-CM | POA: Diagnosis not present

## 2015-11-30 DIAGNOSIS — R519 Headache, unspecified: Secondary | ICD-10-CM

## 2015-11-30 DIAGNOSIS — E119 Type 2 diabetes mellitus without complications: Secondary | ICD-10-CM | POA: Insufficient documentation

## 2015-11-30 DIAGNOSIS — R1110 Vomiting, unspecified: Secondary | ICD-10-CM | POA: Diagnosis not present

## 2015-11-30 DIAGNOSIS — I509 Heart failure, unspecified: Secondary | ICD-10-CM | POA: Diagnosis not present

## 2015-11-30 DIAGNOSIS — R197 Diarrhea, unspecified: Secondary | ICD-10-CM | POA: Diagnosis not present

## 2015-11-30 LAB — CBC WITH DIFFERENTIAL/PLATELET
BASOS ABS: 0 10*3/uL (ref 0.0–0.1)
BASOS PCT: 0 %
Eosinophils Absolute: 0.2 10*3/uL (ref 0.0–0.7)
Eosinophils Relative: 2 %
HEMATOCRIT: 38 % (ref 36.0–46.0)
HEMOGLOBIN: 12.8 g/dL (ref 12.0–15.0)
Lymphocytes Relative: 12 %
Lymphs Abs: 1.2 10*3/uL (ref 0.7–4.0)
MCH: 30.5 pg (ref 26.0–34.0)
MCHC: 33.7 g/dL (ref 30.0–36.0)
MCV: 90.7 fL (ref 78.0–100.0)
MONO ABS: 0.8 10*3/uL (ref 0.1–1.0)
Monocytes Relative: 8 %
NEUTROS ABS: 7.6 10*3/uL (ref 1.7–7.7)
NEUTROS PCT: 78 %
Platelets: 275 10*3/uL (ref 150–400)
RBC: 4.19 MIL/uL (ref 3.87–5.11)
RDW: 13.1 % (ref 11.5–15.5)
WBC: 9.8 10*3/uL (ref 4.0–10.5)

## 2015-11-30 LAB — BASIC METABOLIC PANEL
ANION GAP: 5 (ref 5–15)
BUN: 13 mg/dL (ref 6–20)
CALCIUM: 9.8 mg/dL (ref 8.9–10.3)
CO2: 30 mmol/L (ref 22–32)
Chloride: 104 mmol/L (ref 101–111)
Creatinine, Ser: 0.78 mg/dL (ref 0.44–1.00)
GFR calc non Af Amer: >60 mL/min (ref >60)
Glucose, Bld: 90 mg/dL (ref 65–99)
POTASSIUM: 3.9 mmol/L (ref 3.5–5.1)
Sodium: 139 mmol/L (ref 135–145)

## 2015-11-30 MED ORDER — PROCHLORPERAZINE EDISYLATE 5 MG/ML IJ SOLN
10.0000 mg | Freq: Once | INTRAMUSCULAR | Status: AC
  Administered 2015-11-30: 10 mg via INTRAVENOUS
  Filled 2015-11-30: qty 2

## 2015-11-30 MED ORDER — DEXAMETHASONE SODIUM PHOSPHATE 10 MG/ML IJ SOLN
10.0000 mg | Freq: Once | INTRAMUSCULAR | Status: AC
  Administered 2015-11-30: 10 mg via INTRAVENOUS
  Filled 2015-11-30: qty 1

## 2015-11-30 MED ORDER — ACETAMINOPHEN 500 MG PO TABS: 500.0000 mg | ORAL_TABLET | Freq: Four times a day (QID) | ORAL | Status: AC | PRN

## 2015-11-30 MED ORDER — KETOROLAC TROMETHAMINE 30 MG/ML IJ SOLN
30.0000 mg | Freq: Once | INTRAMUSCULAR | Status: AC
  Administered 2015-11-30: 30 mg via INTRAVENOUS
  Filled 2015-11-30: qty 1

## 2015-11-30 MED ORDER — DIPHENHYDRAMINE HCL 50 MG/ML IJ SOLN
12.5000 mg | Freq: Once | INTRAMUSCULAR | Status: AC
  Administered 2015-11-30: 12.5 mg via INTRAVENOUS
  Filled 2015-11-30: qty 1

## 2015-11-30 NOTE — ED Notes (Signed)
Pt resting quietly & calmly with eyes closed, appears to be sleeping at this time

## 2015-11-30 NOTE — ED Provider Notes (Signed)
CSN: [id number]     Arrival date & time 11/30/15  1508 History   First MD Initiated Contact with Patient 11/30/15 1604     Chief Complaint  Patient presents with  . Headache  . Facial Pain    HPI   Sonya Pennington is a 40 y.o. female with a PMH of COPD, asthma, DM, HTN, CHF who presents to the ED with frontal headache, which she states started yesterday and has been progressively worsening since that time. She reports "everything" makes her pain worse. She has tried excedrin migraine with no significant symptom relief. She states she has a history of migraines and that her symptoms feel similar. She notes associated dizziness, nausea, and vomiting, which she states she typically has when she experiences headache. She denies vision changes, numbness, weakness, paresthesia. She states she was recently diagnosed with the flu, however her symptoms have significantly improved.   Past Medical History  Diagnosis Date  . COPD (chronic obstructive pulmonary disease) (HCC)   . Asthma   . Diabetes type 2, controlled (HCC)     significant weight loss  . Obesity     lost 120 pounds with diet and exercise   . Hypertension     improved after weight loss  . UTI (urinary tract infection)   . CHF (congestive heart failure) [facility])    Past Surgical History  Procedure Laterality Date  . Cesarean section    . Tubal ligation    . Abdominal hysterectomy    . Cardiac catheterization     History reviewed. No pertinent family history. Social History  Substance Use Topics  . Smoking status: Current Every Day Smoker -- 0.50 packs/day  . Smokeless tobacco: None  . Alcohol Use: No   OB History    No data available      Review of Systems  Constitutional: Negative for fever and chills.  Eyes: Negative for visual disturbance.  Gastrointestinal: Positive for vomiting and diarrhea.  Musculoskeletal: Negative for neck pain.  Neurological: Positive for dizziness and headaches. Negative for syncope,  weakness and numbness.  All other systems reviewed and are negative.     Allergies  Penicillins  Home Medications   Prior to Admission medications   Medication Sig Start Date End Date Taking? Authorizing Provider  acetaminophen (TYLENOL) 500 MG tablet Take 1 tablet (500 mg total) by mouth every 6 (six) hours as needed. 11/30/15   Mady Gemma, PA-C  albuterol (PROVENTIL HFA;VENTOLIN HFA) 108 (90 BASE) MCG/ACT inhaler Inhale 2 puffs into the lungs every 6 (six) hours as needed. For shortness of breath    Historical Provider, MD  albuterol (PROVENTIL) (2.5 MG/3ML) 0.083% nebulizer solution Take 2.5 mg by nebulization every 6 (six) hours as needed. 11/24/11 04/08/15  Emeline General, MD  albuterol (PROVENTIL) (2.5 MG/3ML) 0.083% nebulizer solution Take 3 mLs (2.5 mg total) by nebulization every 4 (four) hours as needed for wheezing or shortness of breath. 04/08/15   Gilda Crease, MD  aspirin 81 MG tablet Take 81 mg by mouth daily.    Historical Provider, MD  aspirin-acetaminophen-caffeine (EXCEDRIN MIGRAINE) [phone] MG per tablet Take 2 tablets by mouth every 6 (six) hours as needed. For migraine    Historical Provider, MD  azithromycin (ZITHROMAX) 250 MG tablet Take 1 tablet (250 mg total) by mouth daily. Take first 2 tablets together, then 1 every day until finished. 01/29/15   Robyn M Hess, PA-C  carbamazepine (TEGRETOL-XR) 100 MG 12 hr tablet Take 1 tablet (100  mg total) by mouth 2 (two) times daily. 02/24/13   Carleene CooperAlan Davidson, MD  chlorpheniramine-HYDROcodone [facility](TUSSIONEX PENNKINETIC ER) 10-8 MG/5ML LQCR Take 5 mLs by mouth every 12 (twelve) hours as needed for cough. 07/24/13   Teressa LowerVrinda Pickering, NP  chlorpheniramine-HYDROcodone (TUSSIONEX PENNKINETIC ER) 10-8 MG/5ML SUER Take 5 mLs by mouth every 12 (twelve) hours as needed for cough. 01/29/15   Kathrynn Speedobyn M Hess, PA-C  doxycycline (VIBRAMYCIN) 100 MG capsule Take 1 capsule (100 mg total) by mouth 2 (two) times daily. 06/12/15    Charlestine Nighthristopher Lawyer, PA-C  furosemide (LASIX) 40 MG tablet Take 40 mg by mouth daily.    Historical Provider, MD  glipiZIDE (GLUCOTROL) 5 MG tablet Take by mouth daily before breakfast.    Historical Provider, MD  guaiFENesin-codeine (CHERATUSSIN AC) 100-10 MG/5ML syrup Take 5 mLs by mouth 3 (three) times daily as needed for cough. 01/29/15   Robyn M Hess, PA-C  lisinopril (PRINIVIL,ZESTRIL) 5 MG tablet Take 5 mg by mouth daily.    Historical Provider, MD  Magnesium Hydroxide (MAGNESIA PO) Take by mouth.    Historical Provider, MD  methocarbamol (ROBAXIN) 500 MG tablet Take 1 tablet (500 mg total) by mouth 2 (two) times daily. 12/20/12   Lorre NickAnthony Allen, MD  metolazone (ZAROXOLYN) 2.5 MG tablet Take 2.5 mg by mouth daily.    Historical Provider, MD  metoprolol tartrate (LOPRESSOR) 25 MG tablet Take 25 mg by mouth 2 (two) times daily.    Historical Provider, MD  nitrofurantoin, macrocrystal-monohydrate, (MACROBID) 100 MG capsule Take 1 capsule (100 mg total) by mouth 2 (two) times daily. 11/26/12   Elson AreasLeslie K Sofia, PA-C  OVER THE COUNTER MEDICATION Take 30 mLs by mouth daily as needed. For cough/cold symptoms   Cough/cold/severe allergy liquid    Historical Provider, MD  oxyCODONE-acetaminophen (PERCOCET/ROXICET) 5-325 MG per tablet Take 2 tablets by mouth every 4 (four) hours as needed for pain. 12/20/12   Lorre NickAnthony Allen, MD  Pantoprazole Sodium (PROTONIX PO) Take by mouth.    Historical Provider, MD  polyethylene glycol (MIRALAX) packet Take 17 g by mouth daily. 11/26/12   Elson AreasLeslie K Sofia, PA-C  potassium chloride (K-DUR) 10 MEQ tablet Take 20 mEq by mouth 2 (two) times daily.    Historical Provider, MD  predniSONE (DELTASONE) 50 MG tablet Take 1 tablet (50 mg total) by mouth daily with breakfast. 06/12/15   Charlestine Nighthristopher Lawyer, PA-C  promethazine (PHENERGAN) 25 MG tablet Take 1 tablet (25 mg total) by mouth every 6 (six) hours as needed for nausea. 02/01/13   Quita SkyeMichael Ghim, MD  promethazine-dextromethorphan  (PROMETHAZINE-DM) 6.25-15 MG/5ML syrup Take 5 mLs by mouth 4 (four) times daily as needed for cough. 06/12/15   Charlestine Nighthristopher Lawyer, PA-C  Rivaroxaban (XARELTO) 15 MG TABS tablet Take 15 mg by mouth daily.    Historical Provider, MD  spironolactone (ALDACTONE) 25 MG tablet Take 25 mg by mouth daily.    Historical Provider, MD  sucralfate (CARAFATE) 1 G tablet Take 1 g by mouth 3 (three) times daily.    Historical Provider, MD  TORSEMIDE PO Take by mouth.    Historical Provider, MD  traMADol (ULTRAM) 50 MG tablet Take 1 tablet (50 mg total) by mouth every 6 (six) hours as needed for pain. 02/01/13   Quita SkyeMichael Ghim, MD    BP 119/80 mmHg  Pulse 68  Temp(Src) 98.6 F (37 C) (Oral)  Resp 20  Ht 5\' 4"  (1.626 m)  Wt 95.255 kg  BMI 36.03 kg/m2  SpO2 96% Physical Exam  Constitutional: She is oriented to person, place, and time. She appears well-developed and well-nourished. No distress.  HENT:  Head: Normocephalic and atraumatic.  Right Ear: External ear normal.  Left Ear: External ear normal.  Nose: Nose normal.  Mouth/Throat: Uvula is midline, oropharynx is clear and moist and mucous membranes are normal.  Eyes: Conjunctivae, EOM and lids are normal. Pupils are equal, round, and reactive to light. Right eye exhibits no discharge. Left eye exhibits no discharge. No scleral icterus.  Neck: Normal range of motion. Neck supple.  Cardiovascular: Normal rate, regular rhythm, normal heart sounds, intact distal pulses and normal pulses.   Pulmonary/Chest: Effort normal and breath sounds normal. No respiratory distress. She has no wheezes. She has no rales.  Abdominal: Soft. Normal appearance and bowel sounds are normal. She exhibits no distension and no mass. There is no tenderness. There is no rigidity, no rebound and no guarding.  Musculoskeletal: Normal range of motion. She exhibits no edema or tenderness.  Neurological: She is alert and oriented to person, place, and time. She has normal strength. No  cranial nerve deficit or sensory deficit. Coordination normal.  Patient ambulates without difficulty.  Skin: Skin is warm, dry and intact. No rash noted. She is not diaphoretic. No erythema. No pallor.  Psychiatric: She has a normal mood and affect. Her speech is normal and behavior is normal.  Nursing note and vitals reviewed.   ED Course  Procedures (including critical care time)  Labs Review Labs Reviewed  CBC WITH DIFFERENTIAL/PLATELET  BASIC METABOLIC PANEL    Imaging Review No results found.   I have personally reviewed and evaluated these lab results as part of my medical decision-making.   EKG Interpretation None      MDM   Final diagnoses:  Headache, unspecified headache type    40 year old female presents with headache. Notes dizziness, nausea, and vomiting, which she states are characteristic of her typical migraine. Patient is afebrile. Vital signs stable. Normal neuro exam with no focal deficit. No nuchal rigidity. Patient ambulates without difficulty.  Will give compazine, benadryl, toradol, decadron. CBC negative for leukocytosis or anemia. BMP unremarkable.   On reassessment of patient, she reports significant symptom improvement. Patient is non-toxic and well-appearing, feel she is stable for discharge at this time. Presentation consistent with patient's history of headache, low suspicion for ICH, SAH, meningitis. Patient to follow-up with PCP. Return precautions discussed. Patient verbalizes her understanding and is in agreement with plan.  BP 119/80 mmHg  Pulse 68  Temp(Src) 98.6 F (37 C) (Oral)  Resp 20  Ht  (1.626 m)  Wt 95.255 kg  BMI 36.03 kg/m2  SpO2 96%     Mady Gemma, PA-C 12/01/15 0033  Gwyneth Sprout, MD 12/03/15 1445

## 2015-11-30 NOTE — ED Notes (Signed)
Pt presents with HA, light sensitive, alert and oriented x 3, mae x 4, ambulated to rest room without assistance, gait very steady. Plan of care discussed with pt

## 2015-11-30 NOTE — ED Notes (Signed)
NS infusing via pump at 5220ml/hr, IV site WNL

## 2015-11-30 NOTE — Discharge Instructions (Signed)
1. Medications: tylenol for headache, usual home medications 2. Treatment: rest, drink plenty of fluids 3. Follow Up: please followup with your primary doctor for discussion of your diagnoses and further evaluation after today's visit; if you do not have a primary care doctor use the phone number listed in your discharge paperwork to find one; please return to the ER for severe headache, numbness, weakness, new or worsening symptoms   Migraine Headache A migraine headache is very bad, throbbing pain on one or both sides of your head. Talk to your doctor about what things may bring on (trigger) your migraine headaches. HOME CARE  Only take medicines as told by your doctor.  Lie down in a dark, quiet room when you have a migraine.  Keep a journal to find out if certain things bring on migraine headaches. For example, write down:  What you eat and drink.  How much sleep you get.  Any change to your diet or medicines.  Lessen how much alcohol you drink.  Quit smoking if you smoke.  Get enough sleep.  Lessen any stress in your life.  Keep lights dim if bright lights bother you or make your migraines worse. GET HELP RIGHT AWAY IF:   Your migraine becomes really bad.  You have a fever.  You have a stiff neck.  You have trouble seeing.  Your muscles are weak, or you lose muscle control.  You lose your balance or have trouble walking.  You feel like you will pass out (faint), or you pass out.  You have really bad symptoms that are different than your first symptoms. MAKE SURE YOU:   Understand these instructions.  Will watch your condition.  Will get help right away if you are not doing well or get worse.   This information is not intended to replace advice given to you by your health care provider. Make sure you discuss any questions you have with your health care provider.   Document Released: 05/11/2008 Document Revised: 10/25/2011 Document Reviewed:  04/09/2013 Elsevier Interactive Patient Education [employer]2016 [employer] Inc.

## 2015-11-30 NOTE — ED Notes (Addendum)
Pt in c/o headache and facial pain around eyes onset 3 days ago. States recent flu. Noted to have nasal congestion. Pt is tearful and very upset in triage, neuro intact, NAD.

## 2016-06-02 ENCOUNTER — Encounter (HOSPITAL_BASED_OUTPATIENT_CLINIC_OR_DEPARTMENT_OTHER): Payer: Self-pay | Admitting: *Deleted

## 2016-06-02 ENCOUNTER — Emergency Department (HOSPITAL_BASED_OUTPATIENT_CLINIC_OR_DEPARTMENT_OTHER)
Admission: EM | Admit: 2016-06-02 | Discharge: 2016-06-02 | Disposition: A | Discharge status: Home / Self Care | Payer: Medicaid Other | Attending: Emergency Medicine | Admitting: Emergency Medicine

## 2016-06-02 ENCOUNTER — Emergency Department (HOSPITAL_BASED_OUTPATIENT_CLINIC_OR_DEPARTMENT_OTHER): Payer: Medicaid Other

## 2016-06-02 DIAGNOSIS — I110 Hypertensive heart disease with heart failure: Secondary | ICD-10-CM | POA: Insufficient documentation

## 2016-06-02 DIAGNOSIS — Z7984 Long term (current) use of oral hypoglycemic drugs: Secondary | ICD-10-CM | POA: Diagnosis not present

## 2016-06-02 DIAGNOSIS — Z7951 Long term (current) use of inhaled steroids: Secondary | ICD-10-CM | POA: Diagnosis not present

## 2016-06-02 DIAGNOSIS — Y999 Unspecified external cause status: Secondary | ICD-10-CM | POA: Diagnosis not present

## 2016-06-02 DIAGNOSIS — W540XXA Bitten by dog, initial encounter: Secondary | ICD-10-CM | POA: Insufficient documentation

## 2016-06-02 DIAGNOSIS — I509 Heart failure, unspecified: Secondary | ICD-10-CM | POA: Insufficient documentation

## 2016-06-02 DIAGNOSIS — F17200 Nicotine dependence, unspecified, uncomplicated: Secondary | ICD-10-CM | POA: Diagnosis not present

## 2016-06-02 DIAGNOSIS — Y939 Activity, unspecified: Secondary | ICD-10-CM | POA: Diagnosis not present

## 2016-06-02 DIAGNOSIS — Z79899 Other long term (current) drug therapy: Secondary | ICD-10-CM | POA: Diagnosis not present

## 2016-06-02 DIAGNOSIS — E119 Type 2 diabetes mellitus without complications: Secondary | ICD-10-CM | POA: Insufficient documentation

## 2016-06-02 DIAGNOSIS — S61052A Open bite of left thumb without damage to nail, initial encounter: Secondary | ICD-10-CM | POA: Diagnosis not present

## 2016-06-02 DIAGNOSIS — S61250A Open bite of right index finger without damage to nail, initial encounter: Secondary | ICD-10-CM | POA: Diagnosis present

## 2016-06-02 DIAGNOSIS — J45909 Unspecified asthma, uncomplicated: Secondary | ICD-10-CM | POA: Insufficient documentation

## 2016-06-02 DIAGNOSIS — Z7982 Long term (current) use of aspirin: Secondary | ICD-10-CM | POA: Diagnosis not present

## 2016-06-02 DIAGNOSIS — J449 Chronic obstructive pulmonary disease, unspecified: Secondary | ICD-10-CM | POA: Insufficient documentation

## 2016-06-02 DIAGNOSIS — Y929 Unspecified place or not applicable: Secondary | ICD-10-CM | POA: Insufficient documentation

## 2016-06-02 DIAGNOSIS — L089 Local infection of the skin and subcutaneous tissue, unspecified: Secondary | ICD-10-CM

## 2016-06-02 MED ORDER — METRONIDAZOLE 500 MG PO TABS
500.0000 mg | ORAL_TABLET | Freq: Once | ORAL | Status: AC
  Administered 2016-06-02: 500 mg via ORAL
  Filled 2016-06-02: qty 1

## 2016-06-02 MED ORDER — HYDROCODONE-ACETAMINOPHEN 5-325 MG PO TABS: ORAL_TABLET | ORAL | 0 refills | Status: DC

## 2016-06-02 MED ORDER — DOXYCYCLINE HYCLATE 100 MG PO TABS
100.0000 mg | ORAL_TABLET | Freq: Once | ORAL | Status: AC
  Administered 2016-06-02: 100 mg via ORAL
  Filled 2016-06-02: qty 1

## 2016-06-02 MED ORDER — METRONIDAZOLE 500 MG PO TABS: 500.0000 mg | ORAL_TABLET | Freq: Three times a day (TID) | ORAL | 0 refills | Status: AC

## 2016-06-02 MED ORDER — HYDROCODONE-ACETAMINOPHEN 5-325 MG PO TABS
2.0000 | ORAL_TABLET | Freq: Once | ORAL | Status: AC
  Administered 2016-06-02: 2 via ORAL
  Filled 2016-06-02: qty 2

## 2016-06-02 MED ORDER — DOXYCYCLINE HYCLATE 100 MG PO CAPS: 100.0000 mg | ORAL_CAPSULE | Freq: Two times a day (BID) | ORAL | 0 refills | Status: DC

## 2016-06-02 NOTE — ED Triage Notes (Signed)
Pt c/o animal bite x 2 days ago to bil hands

## 2016-06-02 NOTE — ED Notes (Signed)
PA at bedside.

## 2016-06-02 NOTE — Discharge Instructions (Signed)
Take your antibiotics as directed, keep the hand elevated, turned to the emergency department in 24 hours for recheck. Return sooner if the swelling or pain worsens or if she spiked a fever.

## 2016-06-02 NOTE — ED Provider Notes (Signed)
[facility] Provider Note   CSN: [id number] Arrival date & time: 06/02/16  1809     History   Chief Complaint Chief Complaint  Patient presents with  . Animal Bite     HPI  Blood pressure (!) 108/53, pulse 84, temperature 98.1 F (36.7 C), temperature source Oral, resp. rate 18, height 5' 4.5" (1.638 m), weight 81.6 kg, SpO2 98 %.  Sonya Pennington is a 40 y.o. female complaining of painful swelling to right pointer finger worsening over the course of last 24 hours status post dog bite 2 days prior. Dog was known to her, up-to-date on vaccinations, it was a 2 while why. She has been applying rubbing alcohol and cleaning it at home, pain is significant and worsening, she took a friends vicodin at home with little relief. She denies fever, chills. Patient states she is not diabetic however, chart review says that she is.  Past Medical History:  Diagnosis Date  . Asthma   . CHF (congestive heart failure) (HCC)   . COPD (chronic obstructive pulmonary disease) (HCC)   . Diabetes type 2, controlled (HCC)    significant weight loss  . Hypertension    improved after weight loss  . Obesity    lost 120 pounds with diet and exercise   . UTI (urinary tract infection)     There are no active problems to display for this patient.   Past Surgical History:  Procedure Laterality Date  . ABDOMINAL HYSTERECTOMY    . CARDIAC CATHETERIZATION    . CESAREAN SECTION    . TUBAL LIGATION      OB History    No data available       Home Medications    Prior to Admission medications   Medication Sig Start Date End Date Taking? Authorizing Provider  acetaminophen (TYLENOL) 500 MG tablet Take 1 tablet (500 mg total) by mouth every 6 (six) hours as needed. 11/30/15   Mady Gemma, PA-C  albuterol (PROVENTIL HFA;VENTOLIN HFA) 108 (90 BASE) MCG/ACT inhaler Inhale 2 puffs into the lungs every 6 (six) hours as needed. For shortness of breath    Historical Provider, MD    albuterol (PROVENTIL) (2.5 MG/3ML) 0.083% nebulizer solution Take 2.5 mg by nebulization every 6 (six) hours as needed. 11/24/11 04/08/15  Emeline General, MD  albuterol (PROVENTIL) (2.5 MG/3ML) 0.083% nebulizer solution Take 3 mLs (2.5 mg total) by nebulization every 4 (four) hours as needed for wheezing or shortness of breath. 04/08/15   Gilda Crease, MD  aspirin 81 MG tablet Take 81 mg by mouth daily.    Historical Provider, MD  aspirin-acetaminophen-caffeine (EXCEDRIN MIGRAINE) [phone] MG per tablet Take 2 tablets by mouth every 6 (six) hours as needed. For migraine    Historical Provider, MD  azithromycin (ZITHROMAX) 250 MG tablet Take 1 tablet (250 mg total) by mouth daily. Take first 2 tablets together, then 1 every day until finished. 01/29/15   Robyn M Hess, PA-C  carbamazepine (TEGRETOL-XR) 100 MG 12 hr tablet Take 1 tablet (100 mg total) by mouth 2 (two) times daily. 02/24/13   Carleene Cooper, MD  chlorpheniramine-HYDROcodone [facility] ER) 10-8 MG/5ML LQCR Take 5 mLs by mouth every 12 (twelve) hours as needed for cough. 07/24/13   Teressa Lower, NP  chlorpheniramine-HYDROcodone (TUSSIONEX PENNKINETIC ER) 10-8 MG/5ML SUER Take 5 mLs by mouth every 12 (twelve) hours as needed for cough. 01/29/15   Kathrynn Speed, PA-C  doxycycline (VIBRAMYCIN) 100 MG capsule Take 1  capsule (100 mg total) by mouth 2 (two) times daily. 06/02/16   Yussuf Sawyers, PA-C  furosemide (LASIX) 40 MG tablet Take 40 mg by mouth daily.    Historical Provider, MD  guaiFENesin-codeine (CHERATUSSIN AC) 100-10 MG/5ML syrup Take 5 mLs by mouth 3 (three) times daily as needed for cough. 01/29/15   Kathrynn Speedobyn M Hess, PA-C  HYDROcodone-acetaminophen (NORCO/VICODIN) 5-325 MG tablet Take 1-2 tablets by mouth every 6 hours as needed for pain. 06/02/16   Kaileen Bronkema, PA-C  lisinopril (PRINIVIL,ZESTRIL) 5 MG tablet Take 5 mg by mouth daily.    Historical Provider, MD  Magnesium Hydroxide (MAGNESIA PO) Take by  mouth.    Historical Provider, MD  methocarbamol (ROBAXIN) 500 MG tablet Take 1 tablet (500 mg total) by mouth 2 (two) times daily. 12/20/12   Lorre NickAnthony Allen, MD  metolazone (ZAROXOLYN) 2.5 MG tablet Take 2.5 mg by mouth daily.    Historical Provider, MD  metoprolol tartrate (LOPRESSOR) 25 MG tablet Take 25 mg by mouth 2 (two) times daily.    Historical Provider, MD  metroNIDAZOLE (FLAGYL) 500 MG tablet Take 1 tablet (500 mg total) by mouth 3 (three) times daily. One tab PO bid x 14 days 06/02/16 06/12/16  Joni ReiningNicole Kassady Laboy, PA-C  nitrofurantoin, macrocrystal-monohydrate, (MACROBID) 100 MG capsule Take 1 capsule (100 mg total) by mouth 2 (two) times daily. 11/26/12   Elson AreasLeslie K Sofia, PA-C  OVER THE COUNTER MEDICATION Take 30 mLs by mouth daily as needed. For cough/cold symptoms   Cough/cold/severe allergy liquid    Historical Provider, MD  oxyCODONE-acetaminophen (PERCOCET/ROXICET) 5-325 MG per tablet Take 2 tablets by mouth every 4 (four) hours as needed for pain. 12/20/12   Lorre NickAnthony Allen, MD  Pantoprazole Sodium (PROTONIX PO) Take by mouth.    Historical Provider, MD  polyethylene glycol (MIRALAX) packet Take 17 g by mouth daily. 11/26/12   Elson AreasLeslie K Sofia, PA-C  potassium chloride (K-DUR) 10 MEQ tablet Take 20 mEq by mouth 2 (two) times daily.    Historical Provider, MD  predniSONE (DELTASONE) 50 MG tablet Take 1 tablet (50 mg total) by mouth daily with breakfast. 06/12/15   Charlestine Nighthristopher Lawyer, PA-C  promethazine (PHENERGAN) 25 MG tablet Take 1 tablet (25 mg total) by mouth every 6 (six) hours as needed for nausea. 02/01/13   Quita SkyeMichael Ghim, MD  promethazine-dextromethorphan (PROMETHAZINE-DM) 6.25-15 MG/5ML syrup Take 5 mLs by mouth 4 (four) times daily as needed for cough. 06/12/15   Charlestine Nighthristopher Lawyer, PA-C  Rivaroxaban (XARELTO) 15 MG TABS tablet Take 15 mg by mouth daily.    Historical Provider, MD  spironolactone (ALDACTONE) 25 MG tablet Take 25 mg by mouth daily.    Historical Provider, MD  sucralfate  (CARAFATE) 1 G tablet Take 1 g by mouth 3 (three) times daily.    Historical Provider, MD  TORSEMIDE PO Take by mouth.    Historical Provider, MD  traMADol (ULTRAM) 50 MG tablet Take 1 tablet (50 mg total) by mouth every 6 (six) hours as needed for pain. 02/01/13   Quita SkyeMichael Ghim, MD    Family History History reviewed. No pertinent family history.  Social History Social History  Substance Use Topics  . Smoking status: Current Every Day Smoker    Packs/day: 0.50  . Smokeless tobacco: Not on file  . Alcohol use No     Allergies   Penicillins   Review of Systems Review of Systems  10 systems reviewed and found to be negative, except as noted in the HPI.   Physical  Exam Updated Vital Signs BP (!) 108/53 (BP Location: Left Arm)   Pulse 84   Temp 98.1 F (36.7 C) (Oral)   Resp 18   Ht 5' 4.5" (1.638 m)   Wt 81.6 kg   SpO2 98%   BMI 30.42 kg/m   Physical Exam  Constitutional: She is oriented to person, place, and time. She appears well-developed and well-nourished. No distress.  HENT:  Head: Normocephalic and atraumatic.  Mouth/Throat: Oropharynx is clear and moist.  Eyes: Conjunctivae and EOM are normal. Pupils are equal, round, and reactive to light.  Neck: Normal range of motion.  Cardiovascular: Normal rate, regular rhythm and intact distal pulses.   Pulmonary/Chest: Effort normal and breath sounds normal. No respiratory distress. She has no wheezes. She has no rales. She exhibits no tenderness.  Abdominal: Soft. There is no tenderness.  Musculoskeletal: Normal range of motion.  Neurological: She is alert and oriented to person, place, and time.  Skin: Capillary refill takes less than 2 seconds. She is not diaphoretic.  Puncture wound to left thumb with no significant swelling or tenderness to palpation, no warmth, full range of motion to thumb in flexion and extension.  Right second digit with swelling and puncture wounds to the proximal area as photographed.  Patient can flex and extend but is limited. He is distally neurovascularly intact, she is diffusely tender to palpation no focal tenderness along the tendon sheath.  Psychiatric: She has a normal mood and affect.  Nursing note and vitals reviewed.        ED Treatments / Results  Labs (all labs ordered are listed, but only abnormal results are displayed) Labs Reviewed - No data to display  EKG  EKG Interpretation None       Radiology Dg Hand Complete Left  Result Date: 06/02/2016 CLINICAL DATA:  Dog bite to the thumb. Soft tissue swelling. Injury 2 days ago. EXAM: LEFT HAND - COMPLETE 3+ VIEW COMPARISON:  None. FINDINGS: Soft tissue swelling is present in the thumb. There is no underlying fracture. No foreign body is present. Remainder the hand is unremarkable. IMPRESSION: Soft tissue swelling in the left thumb without underlying fracture or foreign body. Cellulitis is not excluded. Electronically Signed   By: Marin Roberts M.D.   On: 06/02/2016 19:49   Dg Hand Complete Right  Result Date: 06/02/2016 CLINICAL DATA:  Dog bite to the right index finger 2 days ago. EXAM: RIGHT HAND - COMPLETE 3+ VIEW COMPARISON:  Left hand radiographs from the same day. FINDINGS: Soft tissue swelling is present throughout the index finger. There is no underlying fracture. No foreign body is present. The remainder the hand is unremarkable. IMPRESSION: Soft tissue swelling in the right index finger without underlying fracture, foreign body, or dislocation. Cellulitis is not excluded. Electronically Signed   By: Marin Roberts M.D.   On: 06/02/2016 19:48    Procedures Procedures (including critical care time)  Medications Ordered in ED Medications  HYDROcodone-acetaminophen (NORCO/VICODIN) 5-325 MG per tablet 2 tablet (2 tablets Oral Given 06/02/16 1919)  doxycycline (VIBRA-TABS) tablet 100 mg (100 mg Oral Given 06/02/16 1919)  metroNIDAZOLE (FLAGYL) tablet 500 mg (500 mg Oral Given  06/02/16 1919)     Initial Impression / Assessment and Plan / ED Course  I have reviewed the triage vital signs and the nursing notes.  Pertinent labs & imaging results that were available during my care of the patient were reviewed by me and considered in my medical decision making (see chart for  details).  Clinical Course    Vitals:   06/02/16 1813 06/02/16 1814 06/02/16 2059  BP:  118/66 (!) 108/53  Pulse:  110 84  Resp:  18 18  Temp:  98.1 F (36.7 C) 98.1 F (36.7 C)  TempSrc:   Oral  SpO2:  98% 98%  Weight: 81.6 kg    Height: 5' 4.5" (1.638 m)      Medications  HYDROcodone-acetaminophen (NORCO/VICODIN) 5-325 MG per tablet 2 tablet (2 tablets Oral Given 06/02/16 1919)  doxycycline (VIBRA-TABS) tablet 100 mg (100 mg Oral Given 06/02/16 1919)  metroNIDAZOLE (FLAGYL) tablet 500 mg (500 mg Oral Given 06/02/16 1919)    MEL TADROS is 40 y.o. female presenting with Painful swelling to the proximal phalanx of the right second digit, this is an infected bite wound which she sustained 2 days ago. Swelling worsened over the course last 24 hours, pain is severe, patient with penicillin allergy, will give doxy and Flagyl. No signs of systemic infection, she is able to flex and extend the finger but with pain, I don't think this extends into the tendon . Would like to perform ultrasound however patient declines. X-rays without foreign bodies. Advised her to return to the ED tomorrow for a recheck. Incentive discussion of return precautions patient verbalizes understanding and teach back technique     Final Clinical Impressions(s) / ED Diagnoses   Final diagnoses:  Infected dog bite of right index finger, initial encounter      Wynetta Emery, PA-C 06/03/16 0022    Rolland Porter, MD 06/14/16 2117

## 2016-06-02 NOTE — ED Notes (Signed)
Patient reports being bit by a friends dog 2 days ago. Patient with laceration to left thumb, and multiple puncture sites to right first digit. Patient reports she has been using Excedrin, betadine, and neosporin at home, also took a family members hydrocodone at home this afternoon about 4 hours ago which gave her significant relief. Patient reports she can move her fingers, but that the right finger is very painful. Patient reports the dog was severely injured and was surrendered to the vet, but was up to date on his rabies shots.

## 2016-08-02 ENCOUNTER — Other Ambulatory Visit (HOSPITAL_COMMUNITY): Payer: Medicaid Other

## 2016-08-18 ENCOUNTER — Other Ambulatory Visit (HOSPITAL_COMMUNITY): Payer: Medicaid Other

## 2016-09-20 ENCOUNTER — Other Ambulatory Visit: Payer: Self-pay | Admitting: Thoracic Surgery

## 2016-09-20 DIAGNOSIS — I509 Heart failure, unspecified: Secondary | ICD-10-CM

## 2016-10-01 ENCOUNTER — Other Ambulatory Visit (HOSPITAL_COMMUNITY): Payer: Medicaid Other

## 2016-10-12 ENCOUNTER — Emergency Department (HOSPITAL_BASED_OUTPATIENT_CLINIC_OR_DEPARTMENT_OTHER): Payer: Medicaid Other

## 2016-10-12 ENCOUNTER — Emergency Department (HOSPITAL_BASED_OUTPATIENT_CLINIC_OR_DEPARTMENT_OTHER)
Admission: EM | Admit: 2016-10-12 | Discharge: 2016-10-12 | Disposition: A | Discharge status: Home / Self Care | Payer: Medicaid Other | Attending: Emergency Medicine | Admitting: Emergency Medicine

## 2016-10-12 ENCOUNTER — Encounter (HOSPITAL_BASED_OUTPATIENT_CLINIC_OR_DEPARTMENT_OTHER): Payer: Self-pay | Admitting: Emergency Medicine

## 2016-10-12 DIAGNOSIS — S5011XA Contusion of right forearm, initial encounter: Secondary | ICD-10-CM | POA: Diagnosis not present

## 2016-10-12 DIAGNOSIS — I509 Heart failure, unspecified: Secondary | ICD-10-CM | POA: Diagnosis not present

## 2016-10-12 DIAGNOSIS — Y92002 Bathroom of unspecified non-institutional (private) residence single-family (private) house as the place of occurrence of the external cause: Secondary | ICD-10-CM | POA: Insufficient documentation

## 2016-10-12 DIAGNOSIS — J449 Chronic obstructive pulmonary disease, unspecified: Secondary | ICD-10-CM | POA: Diagnosis not present

## 2016-10-12 DIAGNOSIS — Y939 Activity, unspecified: Secondary | ICD-10-CM | POA: Diagnosis not present

## 2016-10-12 DIAGNOSIS — W1809XA Striking against other object with subsequent fall, initial encounter: Secondary | ICD-10-CM | POA: Diagnosis not present

## 2016-10-12 DIAGNOSIS — Y999 Unspecified external cause status: Secondary | ICD-10-CM | POA: Insufficient documentation

## 2016-10-12 DIAGNOSIS — E119 Type 2 diabetes mellitus without complications: Secondary | ICD-10-CM | POA: Diagnosis not present

## 2016-10-12 DIAGNOSIS — Z79899 Other long term (current) drug therapy: Secondary | ICD-10-CM | POA: Diagnosis not present

## 2016-10-12 DIAGNOSIS — Z7982 Long term (current) use of aspirin: Secondary | ICD-10-CM | POA: Insufficient documentation

## 2016-10-12 DIAGNOSIS — F17200 Nicotine dependence, unspecified, uncomplicated: Secondary | ICD-10-CM | POA: Diagnosis not present

## 2016-10-12 DIAGNOSIS — I110 Hypertensive heart disease with heart failure: Secondary | ICD-10-CM | POA: Diagnosis not present

## 2016-10-12 DIAGNOSIS — S59911A Unspecified injury of right forearm, initial encounter: Secondary | ICD-10-CM | POA: Diagnosis present

## 2016-10-12 DIAGNOSIS — J45909 Unspecified asthma, uncomplicated: Secondary | ICD-10-CM | POA: Diagnosis not present

## 2016-10-12 NOTE — ED Triage Notes (Signed)
Pt fell through a rotten floor 1 week ago, c/o R arm pain, bruising noted.

## 2016-10-12 NOTE — ED Provider Notes (Signed)
[facility]   CSN: [id number] Arrival date & time: 10/12/16  1334     History   Chief Complaint Chief Complaint  Patient presents with  . Fall    HPI Sonya Pennington is a 41 y.o. female.  HPI  41 year old female presents with right forearm/elbow pain. One week ago she was going into the bathroom at her son's house and did not know that the carpet was covering a rodded area floor. She fell through the floor as it broke in her arm, the floor. She has been having elbow and forearm pain since. Started having bruising a few days ago on the lateral aspect of her arm. Pain with range of motion. No weakness or numbness. She has tried Tylenol with no relief. She is unable to take NSAIDs as she is on aspirin and Xarelto. She is on this for multiple prior PEs according to her. No other injuries.  Past Medical History:  Diagnosis Date  . Asthma   . CHF (congestive heart failure) (HCC)   . COPD (chronic obstructive pulmonary disease) (HCC)   . Diabetes type 2, controlled (HCC)    significant weight loss  . Hypertension    improved after weight loss  . Obesity    lost 120 pounds with diet and exercise   . UTI (urinary tract infection)     There are no active problems to display for this patient.   Past Surgical History:  Procedure Laterality Date  . ABDOMINAL HYSTERECTOMY    . CARDIAC CATHETERIZATION    . CESAREAN SECTION    . TUBAL LIGATION      OB History    No data available       Home Medications    Prior to Admission medications   Medication Sig Start Date End Date Taking? Authorizing Provider  albuterol (PROVENTIL) (2.5 MG/3ML) 0.083% nebulizer solution Take 3 mLs (2.5 mg total) by nebulization every 4 (four) hours as needed for wheezing or shortness of breath. 04/08/15  Yes Gilda Crease, MD  furosemide (LASIX) 40 MG tablet Take 40 mg by mouth daily.   Yes Historical Provider, MD  lisinopril (PRINIVIL,ZESTRIL) 5 MG tablet Take 5 mg  by mouth daily.   Yes Historical Provider, MD  Magnesium Hydroxide (MAGNESIA PO) Take by mouth.   Yes Historical Provider, MD  metoprolol tartrate (LOPRESSOR) 25 MG tablet Take 25 mg by mouth 2 (two) times daily.   Yes Historical Provider, MD  Rivaroxaban (XARELTO) 15 MG TABS tablet Take 15 mg by mouth daily.   Yes Historical Provider, MD  spironolactone (ALDACTONE) 25 MG tablet Take 25 mg by mouth daily.   Yes Historical Provider, MD  sucralfate (CARAFATE) 1 G tablet Take 1 g by mouth 3 (three) times daily.   Yes Historical Provider, MD  TORSEMIDE PO Take by mouth.   Yes Historical Provider, MD  acetaminophen (TYLENOL) 500 MG tablet Take 1 tablet (500 mg total) by mouth every 6 (six) hours as needed. 11/30/15   Mady Gemma, PA-C  albuterol (PROVENTIL HFA;VENTOLIN HFA) 108 (90 BASE) MCG/ACT inhaler Inhale 2 puffs into the lungs every 6 (six) hours as needed. For shortness of breath    Historical Provider, MD  albuterol (PROVENTIL) (2.5 MG/3ML) 0.083% nebulizer solution Take 2.5 mg by nebulization every 6 (six) hours as needed. 11/24/11 04/08/15  Emeline General, MD  aspirin 81 MG tablet Take 81 mg by mouth daily.    Historical Provider, MD  aspirin-acetaminophen-caffeine [facility] MIGRAINE) [phone]  MG per tablet Take 2 tablets by mouth every 6 (six) hours as needed. For migraine    Historical Provider, MD  azithromycin (ZITHROMAX) 250 MG tablet Take 1 tablet (250 mg total) by mouth daily. Take first 2 tablets together, then 1 every day until finished. 01/29/15   Robyn M Hess, PA-C  carbamazepine (TEGRETOL-XR) 100 MG 12 hr tablet Take 1 tablet (100 mg total) by mouth 2 (two) times daily. 02/24/13   Carleene CooperAlan Davidson, MD  chlorpheniramine-HYDROcodone [facility](TUSSIONEX PENNKINETIC ER) 10-8 MG/5ML LQCR Take 5 mLs by mouth every 12 (twelve) hours as needed for cough. 07/24/13   Teressa LowerVrinda Pickering, NP  chlorpheniramine-HYDROcodone (TUSSIONEX PENNKINETIC ER) 10-8 MG/5ML SUER Take 5 mLs by mouth every 12 (twelve)  hours as needed for cough. 01/29/15   Kathrynn Speedobyn M Hess, PA-C  doxycycline (VIBRAMYCIN) 100 MG capsule Take 1 capsule (100 mg total) by mouth 2 (two) times daily. 06/02/16   Nicole Pisciotta, PA-C  guaiFENesin-codeine (CHERATUSSIN AC) 100-10 MG/5ML syrup Take 5 mLs by mouth 3 (three) times daily as needed for cough. 01/29/15   Kathrynn Speedobyn M Hess, PA-C  HYDROcodone-acetaminophen (NORCO/VICODIN) 5-325 MG tablet Take 1-2 tablets by mouth every 6 hours as needed for pain. 06/02/16   Nicole Pisciotta, PA-C  methocarbamol (ROBAXIN) 500 MG tablet Take 1 tablet (500 mg total) by mouth 2 (two) times daily. 12/20/12   Lorre NickAnthony Allen, MD  metolazone (ZAROXOLYN) 2.5 MG tablet Take 2.5 mg by mouth daily.    Historical Provider, MD  nitrofurantoin, macrocrystal-monohydrate, (MACROBID) 100 MG capsule Take 1 capsule (100 mg total) by mouth 2 (two) times daily. 11/26/12   Elson AreasLeslie K Sofia, PA-C  OVER THE COUNTER MEDICATION Take 30 mLs by mouth daily as needed. For cough/cold symptoms   Cough/cold/severe allergy liquid    Historical Provider, MD  oxyCODONE-acetaminophen (PERCOCET/ROXICET) 5-325 MG per tablet Take 2 tablets by mouth every 4 (four) hours as needed for pain. 12/20/12   Lorre NickAnthony Allen, MD  Pantoprazole Sodium (PROTONIX PO) Take by mouth.    Historical Provider, MD  polyethylene glycol (MIRALAX) packet Take 17 g by mouth daily. 11/26/12   Elson AreasLeslie K Sofia, PA-C  potassium chloride (K-DUR) 10 MEQ tablet Take 20 mEq by mouth 2 (two) times daily.    Historical Provider, MD  predniSONE (DELTASONE) 50 MG tablet Take 1 tablet (50 mg total) by mouth daily with breakfast. 06/12/15   Charlestine Nighthristopher Lawyer, PA-C  promethazine (PHENERGAN) 25 MG tablet Take 1 tablet (25 mg total) by mouth every 6 (six) hours as needed for nausea. 02/01/13   Quita SkyeMichael Ghim, MD  promethazine-dextromethorphan (PROMETHAZINE-DM) 6.25-15 MG/5ML syrup Take 5 mLs by mouth 4 (four) times daily as needed for cough. 06/12/15   Charlestine Nighthristopher Lawyer, PA-C  traMADol (ULTRAM) 50 MG  tablet Take 1 tablet (50 mg total) by mouth every 6 (six) hours as needed for pain. 02/01/13   Quita SkyeMichael Ghim, MD    Family History No family history on file.  Social History Social History  Substance Use Topics  . Smoking status: Current Every Day Smoker    Packs/day: 0.50  . Smokeless tobacco: Never Used  . Alcohol use No     Allergies   Penicillins   Review of Systems Review of Systems  Musculoskeletal: Positive for arthralgias and joint swelling.  Skin: Positive for color change. Negative for wound.  Neurological: Negative for weakness and numbness.  All other systems reviewed and are negative.    Physical Exam Updated Vital Signs BP 95/79 (BP Location: Left Arm)   Pulse  91   Temp 97.9 F (36.6 C) (Oral)   Resp 16   SpO2 100%   Physical Exam  Constitutional: She is oriented to person, place, and time. She appears well-developed and well-nourished.  HENT:  Head: Normocephalic and atraumatic.  Right Ear: External ear normal.  Left Ear: External ear normal.  Nose: Nose normal.  Eyes: Right eye exhibits no discharge. Left eye exhibits no discharge.  Cardiovascular: Normal rate, regular rhythm and normal heart sounds.   Pulses:      Radial pulses are 2+ on the right side.  Pulmonary/Chest: Effort normal.  Musculoskeletal: She exhibits tenderness. She exhibits no deformity.       Right elbow: She exhibits decreased range of motion and swelling (mild). She exhibits no deformity. Tenderness found.       Right upper arm: She exhibits tenderness (distal).       Right forearm: She exhibits tenderness (proximal) and swelling.       Arms: Normal strength/sensation in R hand  Neurological: She is alert and oriented to person, place, and time.  Skin: Skin is warm and dry.  Nursing note and vitals reviewed.    ED Treatments / Results  Labs (all labs ordered are listed, but only abnormal results are displayed) Labs Reviewed - No data to display  EKG  EKG  Interpretation None       Radiology Dg Forearm Right  Result Date: 10/12/2016 CLINICAL DATA:  Fall 2 weeks ago with arm pain, initial encounter EXAM: RIGHT FOREARM - 2 VIEW COMPARISON:  None. FINDINGS: There is no evidence of fracture or other focal bone lesions. Soft tissues are unremarkable. IMPRESSION: No acute abnormality noted. Electronically Signed   By: Alcide Clever M.D.   On: 10/12/2016 14:29    Procedures Procedures (including critical care time)  Medications Ordered in ED Medications - No data to display   Initial Impression / Assessment and Plan / ED Course  I have reviewed the triage vital signs and the nursing notes.  Pertinent labs & imaging results that were available during my care of the patient were reviewed by me and considered in my medical decision making (see chart for details).     Patient's bruising is likely due to her being on a blood thinner. It is mild to moderate. Discussed using ice, elevation, and continue Tylenol as needed. She does have some elbow tenderness and will not let me range it. However there is no obvious effusion. Seems most consistent with a bruise. She had only a forearm x-ray but the distal humerus is visualized within the area of her pain. I highly doubt occult fracture, especially given the injury occurred 1 week ago. Follow-up with PCP.  Final Clinical Impressions(s) / ED Diagnoses   Final diagnoses:  Contusion of right forearm, initial encounter    New Prescriptions New Prescriptions   No medications on file     Pricilla Loveless, MD 10/12/16 1449

## 2016-10-12 NOTE — ED Notes (Signed)
Patient transported to X-ray 

## 2016-10-14 ENCOUNTER — Other Ambulatory Visit (HOSPITAL_COMMUNITY): Payer: Medicaid Other

## 2018-02-08 ENCOUNTER — Other Ambulatory Visit: Payer: Self-pay

## 2018-02-08 ENCOUNTER — Encounter (HOSPITAL_BASED_OUTPATIENT_CLINIC_OR_DEPARTMENT_OTHER): Payer: Self-pay | Admitting: *Deleted

## 2018-02-08 ENCOUNTER — Emergency Department (HOSPITAL_BASED_OUTPATIENT_CLINIC_OR_DEPARTMENT_OTHER)
Admission: EM | Admit: 2018-02-08 | Discharge: 2018-02-08 | Disposition: A | Discharge status: Home / Self Care | Payer: Medicaid Other | Attending: Emergency Medicine | Admitting: Emergency Medicine

## 2018-02-08 ENCOUNTER — Emergency Department (HOSPITAL_BASED_OUTPATIENT_CLINIC_OR_DEPARTMENT_OTHER): Payer: Medicaid Other

## 2018-02-08 DIAGNOSIS — F17210 Nicotine dependence, cigarettes, uncomplicated: Secondary | ICD-10-CM | POA: Diagnosis not present

## 2018-02-08 DIAGNOSIS — X19XXXA Contact with other heat and hot substances, initial encounter: Secondary | ICD-10-CM | POA: Diagnosis not present

## 2018-02-08 DIAGNOSIS — Y9389 Activity, other specified: Secondary | ICD-10-CM | POA: Insufficient documentation

## 2018-02-08 DIAGNOSIS — Z7901 Long term (current) use of anticoagulants: Secondary | ICD-10-CM | POA: Diagnosis not present

## 2018-02-08 DIAGNOSIS — Y92003 Bedroom of unspecified non-institutional (private) residence as the place of occurrence of the external cause: Secondary | ICD-10-CM | POA: Diagnosis not present

## 2018-02-08 DIAGNOSIS — I509 Heart failure, unspecified: Secondary | ICD-10-CM | POA: Insufficient documentation

## 2018-02-08 DIAGNOSIS — E119 Type 2 diabetes mellitus without complications: Secondary | ICD-10-CM | POA: Diagnosis not present

## 2018-02-08 DIAGNOSIS — I110 Hypertensive heart disease with heart failure: Secondary | ICD-10-CM | POA: Insufficient documentation

## 2018-02-08 DIAGNOSIS — M25511 Pain in right shoulder: Secondary | ICD-10-CM | POA: Insufficient documentation

## 2018-02-08 DIAGNOSIS — Z7982 Long term (current) use of aspirin: Secondary | ICD-10-CM | POA: Insufficient documentation

## 2018-02-08 DIAGNOSIS — Y998 Other external cause status: Secondary | ICD-10-CM | POA: Insufficient documentation

## 2018-02-08 DIAGNOSIS — T2114XA Burn of first degree of lower back, initial encounter: Secondary | ICD-10-CM | POA: Diagnosis not present

## 2018-02-08 DIAGNOSIS — J449 Chronic obstructive pulmonary disease, unspecified: Secondary | ICD-10-CM | POA: Diagnosis not present

## 2018-02-08 DIAGNOSIS — T2104XA Burn of unspecified degree of lower back, initial encounter: Secondary | ICD-10-CM | POA: Diagnosis present

## 2018-02-08 MED ORDER — DICLOFENAC SODIUM 1 % TD GEL: 2.0000 g | Freq: Four times a day (QID) | TRANSDERMAL | 0 refills | Status: AC

## 2018-02-08 MED ORDER — DICLOFENAC SODIUM 1 % TD GEL
2.0000 g | Freq: Once | TRANSDERMAL | Status: DC
  Filled 2018-02-08: qty 100

## 2018-02-08 MED ORDER — CYCLOBENZAPRINE HCL 5 MG PO TABS: 5.0000 mg | ORAL_TABLET | Freq: Every day | ORAL | 0 refills | Status: AC

## 2018-02-08 MED ORDER — CYCLOBENZAPRINE HCL 5 MG PO TABS
5.0000 mg | ORAL_TABLET | Freq: Once | ORAL | Status: AC
  Administered 2018-02-08: 5 mg via ORAL
  Filled 2018-02-08: qty 1

## 2018-02-08 NOTE — ED Triage Notes (Signed)
Pt c/o left shoulder pain and back pain x 3 days

## 2018-02-08 NOTE — ED Notes (Signed)
ED Provider at bedside. 
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
## 2018-02-08 NOTE — Discharge Instructions (Addendum)
You may use Flexeril for muscle spasms in back that you may experience. You may use soap and water to clean burn on your back. Follow-up with your PCP if back and shoulder pain continue beyond 2 weeks.

## 2018-02-08 NOTE — ED Notes (Signed)
Pt sent with dressings since the pain relief gel is not available
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
## 2018-02-08 NOTE — ED Notes (Signed)
Pt went out to waiting room

## 2018-02-08 NOTE — ED Notes (Signed)
EDP at bedside  

## 2018-02-08 NOTE — ED Notes (Signed)
Pt went to car to get her phone charger

## 2018-02-08 NOTE — ED Notes (Signed)
Pt placed on 2L as she was out of own O2 and was SOB walking back from xray

## 2018-10-26 ENCOUNTER — Other Ambulatory Visit: Payer: Self-pay

## 2018-10-26 ENCOUNTER — Emergency Department (HOSPITAL_BASED_OUTPATIENT_CLINIC_OR_DEPARTMENT_OTHER)
Admission: EM | Admit: 2018-10-26 | Discharge: 2018-10-26 | Disposition: A | Discharge status: Home / Self Care | Payer: Medicaid Other | Attending: Emergency Medicine | Admitting: Emergency Medicine

## 2018-10-26 ENCOUNTER — Emergency Department (HOSPITAL_BASED_OUTPATIENT_CLINIC_OR_DEPARTMENT_OTHER): Payer: Medicaid Other

## 2018-10-26 ENCOUNTER — Encounter (HOSPITAL_BASED_OUTPATIENT_CLINIC_OR_DEPARTMENT_OTHER): Payer: Self-pay | Admitting: Emergency Medicine

## 2018-10-26 DIAGNOSIS — F17200 Nicotine dependence, unspecified, uncomplicated: Secondary | ICD-10-CM | POA: Diagnosis not present

## 2018-10-26 DIAGNOSIS — Z7901 Long term (current) use of anticoagulants: Secondary | ICD-10-CM | POA: Insufficient documentation

## 2018-10-26 DIAGNOSIS — I110 Hypertensive heart disease with heart failure: Secondary | ICD-10-CM | POA: Insufficient documentation

## 2018-10-26 DIAGNOSIS — E119 Type 2 diabetes mellitus without complications: Secondary | ICD-10-CM | POA: Insufficient documentation

## 2018-10-26 DIAGNOSIS — Z7982 Long term (current) use of aspirin: Secondary | ICD-10-CM | POA: Diagnosis not present

## 2018-10-26 DIAGNOSIS — I509 Heart failure, unspecified: Secondary | ICD-10-CM | POA: Diagnosis not present

## 2018-10-26 DIAGNOSIS — Z79899 Other long term (current) drug therapy: Secondary | ICD-10-CM | POA: Diagnosis not present

## 2018-10-26 DIAGNOSIS — J441 Chronic obstructive pulmonary disease with (acute) exacerbation: Secondary | ICD-10-CM | POA: Insufficient documentation

## 2018-10-26 DIAGNOSIS — R0602 Shortness of breath: Secondary | ICD-10-CM | POA: Diagnosis present

## 2018-10-26 LAB — BASIC METABOLIC PANEL
ANION GAP: 9 (ref 5–15)
BUN: 16 mg/dL (ref 6–20)
CHLORIDE: 93 mmol/L — AB (ref 98–111)
CO2: 32 mmol/L (ref 22–32)
Calcium: 9.2 mg/dL (ref 8.9–10.3)
Creatinine, Ser: 1.07 mg/dL — ABNORMAL HIGH (ref 0.44–1.00)
GFR calc Af Amer: >60 mL/min (ref >60)
GFR calc non Af Amer: >60 mL/min (ref >60)
GLUCOSE: 181 mg/dL — AB (ref 70–99)
POTASSIUM: 3.9 mmol/L (ref 3.5–5.1)
Sodium: 134 mmol/L — ABNORMAL LOW (ref 135–145)

## 2018-10-26 LAB — CBC WITH DIFFERENTIAL/PLATELET
ABS IMMATURE GRANULOCYTES: 0.1 10*3/uL — AB (ref 0.00–0.07)
BASOS ABS: 0 10*3/uL (ref 0.0–0.1)
Basophils Relative: 0 %
Eosinophils Absolute: 0 10*3/uL (ref 0.0–0.5)
Eosinophils Relative: 0 %
HCT: 45.6 % (ref 36.0–46.0)
Hemoglobin: 14.6 g/dL (ref 12.0–15.0)
IMMATURE GRANULOCYTES: 1 %
LYMPHS ABS: 1.6 10*3/uL (ref 0.7–4.0)
LYMPHS PCT: 11 %
MCH: 29.7 pg (ref 26.0–34.0)
MCHC: 32 g/dL (ref 30.0–36.0)
MCV: 92.9 fL (ref 80.0–100.0)
MONOS PCT: 5 %
Monocytes Absolute: 0.7 10*3/uL (ref 0.1–1.0)
NEUTROS ABS: 12.8 10*3/uL — AB (ref 1.7–7.7)
NEUTROS PCT: 83 %
NRBC: 0 % (ref 0.0–0.2)
Platelets: 297 10*3/uL (ref 150–400)
RBC: 4.91 MIL/uL (ref 3.87–5.11)
RDW: 13.5 % (ref 11.5–15.5)
WBC: 15.2 10*3/uL — ABNORMAL HIGH (ref 4.0–10.5)

## 2018-10-26 LAB — BRAIN NATRIURETIC PEPTIDE: B Natriuretic Peptide: 39.6 pg/mL (ref 0.0–100.0)

## 2018-10-26 MED ORDER — PREDNISONE 20 MG PO TABS: ORAL_TABLET | ORAL | 0 refills | Status: DC

## 2018-10-26 MED ORDER — GUAIFENESIN ER 600 MG PO TB12: 600.0000 mg | ORAL_TABLET | Freq: Two times a day (BID) | ORAL | 2 refills | Status: DC

## 2018-10-26 MED ORDER — BENZONATATE 100 MG PO CAPS: 100.0000 mg | ORAL_CAPSULE | Freq: Three times a day (TID) | ORAL | 0 refills | Status: DC

## 2018-10-26 MED ORDER — PREDNISONE 10 MG PO TABS
60.0000 mg | ORAL_TABLET | Freq: Once | ORAL | Status: AC
  Administered 2018-10-26: 60 mg via ORAL
  Filled 2018-10-26: qty 1

## 2018-10-26 MED ORDER — IPRATROPIUM-ALBUTEROL 0.5-2.5 (3) MG/3ML IN SOLN
3.0000 mL | Freq: Four times a day (QID) | RESPIRATORY_TRACT | Status: DC
  Administered 2018-10-26: 3 mL via RESPIRATORY_TRACT
  Filled 2018-10-26: qty 3

## 2018-10-26 MED FILL — BENZONATATE 100 MG CAP: 100 | 7 days supply | Qty: 21 | Fill #0

## 2018-10-26 MED FILL — predniSONE 20 MG TABS: 20 | 4 days supply | Qty: 8 | Fill #0

## 2018-10-26 NOTE — ED Provider Notes (Signed)
[facility] EMERGENCY DEPARTMENT Provider Note   CSN: [id number] Arrival date & time: 10/26/18  1432    History   Chief Complaint Chief Complaint  Patient presents with  . Shortness of Breath    HPI Sonya Pennington is a 43 y.o. female.     HPI Patient with 2 months of on and off again productive cough.  Patient has known history of COPD with long prior smoking history.  She reports the cough will have a whitish or creamy colored sputum intermittently.  She has had 2 courses of Zithromax within the past 2 months.  She reports that soon as it starts to get better for a little while it comes back again.  Patient reports sometimes she feels short of breath.  She uses home nebulizer treatments.  Sometimes she feels like if she could use a second treatment it would help more.  Patient denies any pain or swelling in her legs.  She denies chest pain.  She reports she does not think this is related to her heart.  She has not had fevers.  No body aches.  She reports she is almost quit smoking completely by this time.  She has not been on prednisone taper or burst for "quite a while".  She reports 2 days ago she did start taking 10 mg a day of some leftover prednisone.  She reports that she has been scheduled to pulmonologist but that appointment is almost 2 months away. Past Medical History:  Diagnosis Date  . Asthma   . CHF (congestive heart failure) (HCC)   . COPD (chronic obstructive pulmonary disease) (HCC)   . Diabetes type 2, controlled (HCC)    significant weight loss  . Hypertension    improved after weight loss  . Obesity    lost 120 pounds with diet and exercise   . UTI (urinary tract infection)     There are no active problems to display for this patient.   Past Surgical History:  Procedure Laterality Date  . ABDOMINAL HYSTERECTOMY    . CARDIAC CATHETERIZATION    . CESAREAN SECTION    . TUBAL LIGATION       OB History   No obstetric history on file.      Home Medications    Prior to Admission medications   Medication Sig Start Date End Date Taking? Authorizing Provider  acetaminophen (TYLENOL) 500 MG tablet Take 1 tablet (500 mg total) by mouth every 6 (six) hours as needed. 11/30/15   Mady Gemma, PA-C  albuterol (PROVENTIL HFA;VENTOLIN HFA) 108 (90 BASE) MCG/ACT inhaler Inhale 2 puffs into the lungs every 6 (six) hours as needed. For shortness of breath    [provider]  albuterol (PROVENTIL) (2.5 MG/3ML) 0.083% nebulizer solution Take 2.5 mg by nebulization every 6 (six) hours as needed. 11/24/11 04/08/15  Emeline General, MD  albuterol (PROVENTIL) (2.5 MG/3ML) 0.083% nebulizer solution Take 3 mLs (2.5 mg total) by nebulization every 4 (four) hours as needed for wheezing or shortness of breath. 04/08/15   Pollina, Canary Brim, MD  aspirin 81 MG tablet Take 81 mg by mouth daily.    [provider]  aspirin-acetaminophen-caffeine (EXCEDRIN MIGRAINE) [phone] MG per tablet Take 2 tablets by mouth every 6 (six) hours as needed. For migraine    [provider]  azithromycin (ZITHROMAX) 250 MG tablet Take 1 tablet (250 mg total) by mouth daily. Take first 2 tablets together, then 1 every day until finished. 01/29/15  Hess, Robyn M, PA-C  benzonatate (TESSALON) 100 MG capsule Take 1 capsule (100 mg total) by mouth every 8 (eight) hours. 10/26/18   Arby Barrette, MD  carbamazepine (TEGRETOL-XR) 100 MG 12 hr tablet Take 1 tablet (100 mg total) by mouth 2 (two) times daily. 02/24/13   Carleene Cooper, MD  chlorpheniramine-HYDROcodone [facility] ER) 10-8 MG/5ML [facility] Take 5 mLs by mouth every 12 (twelve) hours as needed for cough. 07/24/13   Teressa Lower, NP  chlorpheniramine-HYDROcodone (TUSSIONEX PENNKINETIC ER) 10-8 MG/5ML SUER Take 5 mLs by mouth every 12 (twelve) hours as needed for cough. 01/29/15   Hess, Nada Boozer, PA-C  doxycycline (VIBRAMYCIN) 100 MG capsule Take 1 capsule (100 mg total) by mouth 2  (two) times daily. 06/02/16   Pisciotta, Joni Reining, PA-C  furosemide (LASIX) 40 MG tablet Take 40 mg by mouth daily.    [provider]  guaiFENesin (MUCINEX) 600 MG 12 hr tablet Take 1 tablet (600 mg total) by mouth 2 (two) times daily. 10/26/18   Arby Barrette, MD  guaiFENesin-codeine (CHERATUSSIN AC) 100-10 MG/5ML syrup Take 5 mLs by mouth 3 (three) times daily as needed for cough. 01/29/15   Hess, Nada Boozer, PA-C  HYDROcodone-acetaminophen (NORCO/VICODIN) 5-325 MG tablet Take 1-2 tablets by mouth every 6 hours as needed for pain. 06/02/16   Pisciotta, Joni Reining, PA-C  lisinopril (PRINIVIL,ZESTRIL) 5 MG tablet Take 5 mg by mouth daily.    [provider]  Magnesium Hydroxide (MAGNESIA PO) Take by mouth.    [provider]  methocarbamol (ROBAXIN) 500 MG tablet Take 1 tablet (500 mg total) by mouth 2 (two) times daily. 12/20/12   Lorre Nick, MD  metolazone (ZAROXOLYN) 2.5 MG tablet Take 2.5 mg by mouth daily.    [provider]  metoprolol tartrate (LOPRESSOR) 25 MG tablet Take 25 mg by mouth 2 (two) times daily.    [provider]  nitrofurantoin, macrocrystal-monohydrate, (MACROBID) 100 MG capsule Take 1 capsule (100 mg total) by mouth 2 (two) times daily. 11/26/12   Elson Areas, PA-C  OVER THE COUNTER MEDICATION Take 30 mLs by mouth daily as needed. For cough/cold symptoms   Cough/cold/severe allergy liquid    [provider]  oxyCODONE-acetaminophen (PERCOCET/ROXICET) 5-325 MG per tablet Take 2 tablets by mouth every 4 (four) hours as needed for pain. 12/20/12   Lorre Nick, MD  Pantoprazole Sodium (PROTONIX PO) Take by mouth.    [provider]  polyethylene glycol (MIRALAX) packet Take 17 g by mouth daily. 11/26/12   Elson Areas, PA-C  potassium chloride (K-DUR) 10 MEQ tablet Take 20 mEq by mouth 2 (two) times daily.    [provider]  predniSONE (DELTASONE) 20 MG tablet 2 tabs daily x 4 days 10/27/18   Arby Barrette,  MD  predniSONE (DELTASONE) 50 MG tablet Take 1 tablet (50 mg total) by mouth daily with breakfast. 06/12/15   Lawyer, Cristal Deer, PA-C  promethazine (PHENERGAN) 25 MG tablet Take 1 tablet (25 mg total) by mouth every 6 (six) hours as needed for nausea. 02/01/13   Quita Skye, MD  promethazine-dextromethorphan (PROMETHAZINE-DM) 6.25-15 MG/5ML syrup Take 5 mLs by mouth 4 (four) times daily as needed for cough. 06/12/15   Lawyer, Cristal Deer, PA-C  Rivaroxaban (XARELTO) 15 MG TABS tablet Take 15 mg by mouth daily.    [provider]  spironolactone (ALDACTONE) 25 MG tablet Take 25 mg by mouth daily.    [provider]  sucralfate (CARAFATE) 1 G tablet Take 1 g by  mouth 3 (three) times daily.    [provider]  TORSEMIDE PO Take by mouth.    [provider]  traMADol (ULTRAM) 50 MG tablet Take 1 tablet (50 mg total) by mouth every 6 (six) hours as needed for pain. 02/01/13   Quita Skye, MD    Family History History reviewed. No pertinent family history.  Social History Social History   Tobacco Use  . Smoking status: Current Every Day Smoker    Packs/day: 0.50  . Smokeless tobacco: Never Used  Substance Use Topics  . Alcohol use: No  . Drug use: No     Allergies   Penicillins   Review of Systems Review of Systems  10 Systems reviewed and are negative for acute change except as noted in the HPI.  Physical Exam Updated Vital Signs BP 92/71 (BP Location: Left Arm)   Pulse 89   Temp 98.6 F (37 C) (Oral)   Resp 18   Ht  (1.626 m)   Wt 106.6 kg   SpO2 97%   BMI 40.34 kg/m   Physical Exam Constitutional:      Comments: Alert nontoxic no respiratory distress at rest.  HENT:     Right Ear: Tympanic membrane normal.     Left Ear: Tympanic membrane normal.     Nose: Nose normal.     Mouth/Throat:     Mouth: Mucous membranes are moist.     Pharynx: Oropharynx is clear.  Eyes:     Extraocular Movements: Extraocular movements  intact.  Cardiovascular:     Rate and Rhythm: Normal rate and regular rhythm.  Pulmonary:     Comments: Dry paroxysmal cough.  Wheezing throughout lung fields.  Decreased breath sounds at the bases. Abdominal:     General: There is no distension.     Palpations: Abdomen is soft.     Tenderness: There is no abdominal tenderness. There is no guarding.  Musculoskeletal: Normal range of motion.     Comments: No peripheral edema.  Calves are soft nontender.  Feet warm and dry.  Skin:    General: Skin is warm and dry.  Neurological:     General: No focal deficit present.     Mental Status: She is oriented to person, place, and time.     Coordination: Coordination normal.  Psychiatric:        Mood and Affect: Mood normal.      ED Treatments / Results  Labs (all labs ordered are listed, but only abnormal results are displayed) Labs Reviewed  BASIC METABOLIC PANEL - Abnormal; Notable for the following components:      Result Value   Sodium 134 (*)    Chloride 93 (*)    Glucose, Bld 181 (*)    Creatinine, Ser 1.07 (*)    All other components within normal limits  CBC WITH DIFFERENTIAL/PLATELET - Abnormal; Notable for the following components:   WBC 15.2 (*)    Neutro Abs 12.8 (*)    Abs Immature Granulocytes 0.10 (*)    All other components within normal limits  BRAIN NATRIURETIC PEPTIDE    EKG None  Radiology Dg Chest 2 View  Result Date: 10/26/2018 CLINICAL DATA:  Shortness of breath, cough. EXAM: CHEST - 2 VIEW COMPARISON:  Radiographs of October 01, 2018. FINDINGS: The heart size and mediastinal contours are within normal limits. Both lungs are clear. No pneumothorax or pleural effusion is noted. The visualized skeletal structures are unremarkable. IMPRESSION: No active cardiopulmonary disease.  Electronically Signed   By: Lupita Raider, M.D.   On: 10/26/2018 15:51    Procedures Procedures (including critical care time)  Medications Ordered in ED Medications   ipratropium-albuterol (DUONEB) 0.5-2.5 (3) MG/3ML nebulizer solution 3 mL (3 mLs Nebulization Given 10/26/18 1544)  predniSONE (DELTASONE) tablet 60 mg (has no administration in time range)     Initial Impression / Assessment and Plan / ED Course  I have reviewed the triage vital signs and the nursing notes.  Pertinent labs & imaging results that were available during my care of the patient were reviewed by me and considered in my medical decision making (see chart for details).        Chest x-ray is stable.  Patient clinically has well appearance.  At this time with 2 months of waxing waning symptoms and white sputum with wheezing this appears consistent with COPD exacerbation.  Patient has had 2 courses of Zithromax without improvement.  Will recommend a burst of prednisone and close follow-up with her PCP.  Patient has self started prednisone at 10 mg a day a couple days ago.  Return precautions reviewed.  Final Clinical Impressions(s) / ED Diagnoses   Final diagnoses:  COPD exacerbation [facility])    ED Discharge Orders         Ordered    predniSONE (DELTASONE) 20 MG tablet     10/26/18 1626    guaiFENesin (MUCINEX) 600 MG 12 hr tablet  2 times daily     10/26/18 1626    benzonatate (TESSALON) 100 MG capsule  Every 8 hours     10/26/18 1626           Arby Barrette, MD 10/26/18 1629

## 2018-10-26 NOTE — ED Triage Notes (Signed)
Reports shortness of breath on exertion for weeks.  Seen and treated with abx and steroids for bronchitis.  States this was getting better but came back.

## 2018-10-26 NOTE — Discharge Instructions (Signed)
1.  Start taking the prednisone as prescribed tomorrow. 2.  Use your albuterol nebulizer every 4-6 hours while your symptoms are increased. 3.  Make a follow-up appointment with your doctor within the next 3 to 5 days.

## 2018-10-26 NOTE — ED Notes (Signed)
Pt on monitor 

## 2018-12-05 ENCOUNTER — Emergency Department (HOSPITAL_BASED_OUTPATIENT_CLINIC_OR_DEPARTMENT_OTHER)
Admission: EM | Admit: 2018-12-05 | Discharge: 2018-12-06 | Disposition: A | Discharge status: Home / Self Care | Payer: Medicaid Other | Attending: Emergency Medicine | Admitting: Emergency Medicine

## 2018-12-05 ENCOUNTER — Other Ambulatory Visit: Payer: Self-pay

## 2018-12-05 ENCOUNTER — Encounter (HOSPITAL_BASED_OUTPATIENT_CLINIC_OR_DEPARTMENT_OTHER): Payer: Self-pay | Admitting: Emergency Medicine

## 2018-12-05 DIAGNOSIS — Z7982 Long term (current) use of aspirin: Secondary | ICD-10-CM | POA: Insufficient documentation

## 2018-12-05 DIAGNOSIS — E119 Type 2 diabetes mellitus without complications: Secondary | ICD-10-CM | POA: Diagnosis not present

## 2018-12-05 DIAGNOSIS — I110 Hypertensive heart disease with heart failure: Secondary | ICD-10-CM | POA: Insufficient documentation

## 2018-12-05 DIAGNOSIS — R0789 Other chest pain: Secondary | ICD-10-CM | POA: Diagnosis not present

## 2018-12-05 DIAGNOSIS — J449 Chronic obstructive pulmonary disease, unspecified: Secondary | ICD-10-CM | POA: Insufficient documentation

## 2018-12-05 DIAGNOSIS — Z7901 Long term (current) use of anticoagulants: Secondary | ICD-10-CM | POA: Diagnosis not present

## 2018-12-05 DIAGNOSIS — I509 Heart failure, unspecified: Secondary | ICD-10-CM | POA: Diagnosis not present

## 2018-12-05 DIAGNOSIS — Z79899 Other long term (current) drug therapy: Secondary | ICD-10-CM | POA: Insufficient documentation

## 2018-12-05 DIAGNOSIS — F17210 Nicotine dependence, cigarettes, uncomplicated: Secondary | ICD-10-CM | POA: Diagnosis not present

## 2018-12-05 DIAGNOSIS — R079 Chest pain, unspecified: Secondary | ICD-10-CM | POA: Diagnosis present

## 2018-12-05 NOTE — ED Triage Notes (Signed)
Pt states she is having chest pain in her left chest that started about 5-6 hours ago  Pt describes it as a stabbing pain that comes and goes and the pain radiates up toward her neck

## 2018-12-06 ENCOUNTER — Emergency Department (HOSPITAL_BASED_OUTPATIENT_CLINIC_OR_DEPARTMENT_OTHER): Payer: Medicaid Other

## 2018-12-06 LAB — COMPREHENSIVE METABOLIC PANEL
ALT: 22 U/L (ref 0–44)
AST: 19 U/L (ref 15–41)
Albumin: 3.6 g/dL (ref 3.5–5.0)
Alkaline Phosphatase: 48 U/L (ref 38–126)
Anion gap: 5 (ref 5–15)
BUN: 14 mg/dL (ref 6–20)
CO2: 30 mmol/L (ref 22–32)
Calcium: 8.9 mg/dL (ref 8.9–10.3)
Chloride: 100 mmol/L (ref 98–111)
Creatinine, Ser: 0.77 mg/dL (ref 0.44–1.00)
GFR calc Af Amer: >60 mL/min (ref >60)
GFR calc non Af Amer: >60 mL/min (ref >60)
Glucose, Bld: 140 mg/dL — ABNORMAL HIGH (ref 70–99)
Potassium: 3.5 mmol/L (ref 3.5–5.1)
Sodium: 135 mmol/L (ref 135–145)
Total Bilirubin: 0.3 mg/dL (ref 0.3–1.2)
Total Protein: 6.5 g/dL (ref 6.5–8.1)

## 2018-12-06 LAB — CBC WITH DIFFERENTIAL/PLATELET
Abs Immature Granulocytes: 0.04 10*3/uL (ref 0.00–0.07)
Basophils Absolute: 0 10*3/uL (ref 0.0–0.1)
Basophils Relative: 0 %
Eosinophils Absolute: 0 10*3/uL (ref 0.0–0.5)
Eosinophils Relative: 0 %
HCT: 39.6 % (ref 36.0–46.0)
Hemoglobin: 13 g/dL (ref 12.0–15.0)
Immature Granulocytes: 0 %
Lymphocytes Relative: 26 %
Lymphs Abs: 2.9 10*3/uL (ref 0.7–4.0)
MCH: 30.8 pg (ref 26.0–34.0)
MCHC: 32.8 g/dL (ref 30.0–36.0)
MCV: 93.8 fL (ref 80.0–100.0)
Monocytes Absolute: 0.8 10*3/uL (ref 0.1–1.0)
Monocytes Relative: 7 %
Neutro Abs: 7.3 10*3/uL (ref 1.7–7.7)
Neutrophils Relative %: 67 %
Platelets: 243 10*3/uL (ref 150–400)
RBC: 4.22 MIL/uL (ref 3.87–5.11)
RDW: 12.8 % (ref 11.5–15.5)
WBC: 11 10*3/uL — ABNORMAL HIGH (ref 4.0–10.5)
nRBC: 0 % (ref 0.0–0.2)

## 2018-12-06 LAB — D-DIMER, QUANTITATIVE (NOT AT ARMC): D-Dimer, Quant: <0.27 ug/mL-FEU (ref 0.00–0.50)

## 2018-12-06 LAB — BRAIN NATRIURETIC PEPTIDE: B Natriuretic Peptide: 28 pg/mL (ref 0.0–100.0)

## 2018-12-06 LAB — TROPONIN I: Troponin I: <0.03 ng/mL (ref <0.03)

## 2018-12-06 MED ORDER — METHOCARBAMOL 500 MG PO TABS: 500.0000 mg | ORAL_TABLET | Freq: Three times a day (TID) | ORAL | 0 refills | Status: DC | PRN

## 2018-12-06 MED ORDER — SODIUM CHLORIDE 0.9 % IV SOLN: Freq: Once | INTRAVENOUS | Status: DC

## 2018-12-06 MED ORDER — SODIUM CHLORIDE 0.9 % IV BOLUS
500.0000 mL | Freq: Once | INTRAVENOUS | Status: AC
  Administered 2018-12-06: 500 mL via INTRAVENOUS

## 2018-12-06 NOTE — ED Notes (Signed)
ED Provider at bedside. 

## 2018-12-06 NOTE — ED Notes (Signed)
Pt. Reports to RN she has not been drinking a lot today and feels she may be dehydrated.  Pt. Said she doesn't feel like she is having a heart attack

## 2018-12-06 NOTE — ED Provider Notes (Signed)
[facility] EMERGENCY DEPARTMENT Provider Note   CSN: [id number] Arrival date & time: 12/05/18  2343    History   Chief Complaint Chief Complaint  Patient presents with   Chest Pain    HPI Sonya Pennington is a 43 y.o. female.     HPI 43 year old female with history of hypertension, diabetes, CHF, previous polysubstance abuse, here with chest pain.  The patient states she was in her usual state of health until approximately 6 hours ago.  She then noticed a sharp, pinpoint, left upper chest pain.  The pain seems to be fairly constant and is not specifically made worse with movement and palpation.  It is slightly relieved when she leans forward.  She has not noticed any worsening shortness of breath or cough with this.  No myalgias.  No recent immobilization.  She has been increasingly active and has actually been exercising, which she has been doing since her diagnosis of heart failure.  Her last ejection fraction had improved to 40%.  During her previous work-ups, she denies known history of coronary disease and had a "clean cath" in the past.  No personal or family history of early heart disease.  The pain is sharp, pinpoint, and does not necessarily radiate.  She did feel some slight discomfort in her left upper neck with the pain, though this is not been constant.  No diaphoresis.  No fevers or chills.  No cough.  Past Medical History:  Diagnosis Date   Asthma    CHF (congestive heart failure) (HCC)    COPD (chronic obstructive pulmonary disease) (HCC)    Diabetes type 2, controlled (HCC)    significant weight loss   Hypertension    improved after weight loss   Obesity    lost 120 pounds with diet and exercise    UTI (urinary tract infection)     There are no active problems to display for this patient.   Past Surgical History:  Procedure Laterality Date   ABDOMINAL HYSTERECTOMY     CARDIAC CATHETERIZATION     CESAREAN SECTION     TUBAL LIGATION         OB History   No obstetric history on file.      Home Medications    Prior to Admission medications   Medication Sig Start Date End Date Taking? Authorizing Provider  acetaminophen (TYLENOL) 500 MG tablet Take 1 tablet (500 mg total) by mouth every 6 (six) hours as needed. 11/30/15   Mady Gemma, PA-C  albuterol (PROVENTIL HFA;VENTOLIN HFA) 108 (90 BASE) MCG/ACT inhaler Inhale 2 puffs into the lungs every 6 (six) hours as needed. For shortness of breath    [provider]  albuterol (PROVENTIL) (2.5 MG/3ML) 0.083% nebulizer solution Take 2.5 mg by nebulization every 6 (six) hours as needed. 11/24/11 04/08/15  Emeline General, MD  albuterol (PROVENTIL) (2.5 MG/3ML) 0.083% nebulizer solution Take 3 mLs (2.5 mg total) by nebulization every 4 (four) hours as needed for wheezing or shortness of breath. 04/08/15   Pollina, Canary Brim, MD  aspirin 81 MG tablet Take 81 mg by mouth daily.    [provider]  aspirin-acetaminophen-caffeine (EXCEDRIN MIGRAINE) [phone] MG per tablet Take 2 tablets by mouth every 6 (six) hours as needed. For migraine    [provider]  azithromycin (ZITHROMAX) 250 MG tablet Take 1 tablet (250 mg total) by mouth daily. Take first 2 tablets together, then 1 every day until finished. 01/29/15   Hess, Melina Schools  M, PA-C  benzonatate (TESSALON) 100 MG capsule Take 1 capsule (100 mg total) by mouth every 8 (eight) hours. 10/26/18   Arby Barrette, MD  carbamazepine (TEGRETOL-XR) 100 MG 12 hr tablet Take 1 tablet (100 mg total) by mouth 2 (two) times daily. 02/24/13   Carleene Cooper, MD  chlorpheniramine-HYDROcodone [facility]) 10-8 MG/5ML [facility] Take 5 mLs by mouth every 12 (twelve) hours as needed for cough. 07/24/13   Teressa Lower, NP  chlorpheniramine-HYDROcodone (TUSSIONEX PENNKINETIC ER) 10-8 MG/5ML SUER Take 5 mLs by mouth every 12 (twelve) hours as needed for cough. 01/29/15   Hess, Nada Boozer, PA-C  doxycycline  (VIBRAMYCIN) 100 MG capsule Take 1 capsule (100 mg total) by mouth 2 (two) times daily. 06/02/16   Pisciotta, Joni Reining, PA-C  furosemide (LASIX) 40 MG tablet Take 40 mg by mouth daily.    [provider]  guaiFENesin (MUCINEX) 600 MG 12 hr tablet Take 1 tablet (600 mg total) by mouth 2 (two) times daily. 10/26/18   Arby Barrette, MD  guaiFENesin-codeine (CHERATUSSIN AC) 100-10 MG/5ML syrup Take 5 mLs by mouth 3 (three) times daily as needed for cough. 01/29/15   Hess, Nada Boozer, PA-C  HYDROcodone-acetaminophen (NORCO/VICODIN) 5-325 MG tablet Take 1-2 tablets by mouth every 6 hours as needed for pain. 06/02/16   Pisciotta, Joni Reining, PA-C  lisinopril (PRINIVIL,ZESTRIL) 5 MG tablet Take 5 mg by mouth daily.    [provider]  Magnesium Hydroxide (MAGNESIA PO) Take by mouth.    [provider]  methocarbamol (ROBAXIN) 500 MG tablet Take 1 tablet (500 mg total) by mouth every 8 (eight) hours as needed for muscle spasms (chest wall pain/muscle spasms). 12/06/18   Shaune Pollack, MD  metolazone (ZAROXOLYN) 2.5 MG tablet Take 2.5 mg by mouth daily.    [provider]  metoprolol tartrate (LOPRESSOR) 25 MG tablet Take 25 mg by mouth 2 (two) times daily.    [provider]  nitrofurantoin, macrocrystal-monohydrate, (MACROBID) 100 MG capsule Take 1 capsule (100 mg total) by mouth 2 (two) times daily. 11/26/12   Elson Areas, PA-C  OVER THE COUNTER MEDICATION Take 30 mLs by mouth daily as needed. For cough/cold symptoms   Cough/cold/severe allergy liquid    [provider]  oxyCODONE-acetaminophen (PERCOCET/ROXICET) 5-325 MG per tablet Take 2 tablets by mouth every 4 (four) hours as needed for pain. 12/20/12   Lorre Nick, MD  Pantoprazole Sodium (PROTONIX PO) Take by mouth.    [provider]  polyethylene glycol (MIRALAX) packet Take 17 g by mouth daily. 11/26/12   Elson Areas, PA-C  potassium chloride (K-DUR) 10 MEQ tablet Take 20 mEq by mouth 2  (two) times daily.    [provider]  predniSONE (DELTASONE) 20 MG tablet 2 tabs daily x 4 days 10/27/18   Arby Barrette, MD  predniSONE (DELTASONE) 50 MG tablet Take 1 tablet (50 mg total) by mouth daily with breakfast. 06/12/15   Lawyer, Cristal Deer, PA-C  promethazine (PHENERGAN) 25 MG tablet Take 1 tablet (25 mg total) by mouth every 6 (six) hours as needed for nausea. 02/01/13   Quita Skye, MD  promethazine-dextromethorphan (PROMETHAZINE-DM) 6.25-15 MG/5ML syrup Take 5 mLs by mouth 4 (four) times daily as needed for cough. 06/12/15   Lawyer, Cristal Deer, PA-C  Rivaroxaban (XARELTO) 15 MG TABS tablet Take 15 mg by mouth daily.    [provider]  spironolactone (ALDACTONE) 25 MG tablet Take 25 mg by mouth daily.    [provider]  sucralfate (CARAFATE)  1 G tablet Take 1 g by mouth 3 (three) times daily.    [provider]  TORSEMIDE PO Take by mouth.    [provider]  traMADol (ULTRAM) 50 MG tablet Take 1 tablet (50 mg total) by mouth every 6 (six) hours as needed for pain. 02/01/13   Quita SkyeGhim, Michael, MD    Family History Family History  Problem Relation Age of Onset   Cancer Mother    COPD Mother    Hypertension Mother    Cancer Father    Kidney disease Father    Diabetes Father    Hypertension Father     Social History Social History   Tobacco Use   Smoking status: Current Every Day Smoker    Packs/day: 0.50   Smokeless tobacco: Never Used  Substance Use Topics   Alcohol use: No   Drug use: Yes    Types: Marijuana     Allergies   Penicillins   Review of Systems Review of Systems  Respiratory: Positive for chest tightness.   Cardiovascular: Positive for chest pain.  All other systems reviewed and are negative.    Physical Exam Updated Vital Signs BP 112/63    Pulse 77    Temp 98.3 F (36.8 C) (Oral)    Resp (!) 22    Ht 5\' 4"  (1.626 m)    Wt 105.7 kg    SpO2 97%    BMI 39.99 kg/m   Physical  Exam   ED Treatments / Results  Labs (all labs ordered are listed, but only abnormal results are displayed) Labs Reviewed  CBC WITH DIFFERENTIAL/PLATELET - Abnormal; Notable for the following components:      Result Value   WBC 11.0 (*)    All other components within normal limits  COMPREHENSIVE METABOLIC PANEL - Abnormal; Notable for the following components:   Glucose, Bld 140 (*)    All other components within normal limits  TROPONIN I  BRAIN NATRIURETIC PEPTIDE  D-DIMER, QUANTITATIVE (NOT AT [facility])    EKG EKG Interpretation  Date/Time:  Tuesday December 05 2018 23:55:29 EDT Ventricular Rate:  83 PR Interval:    QRS Duration: 91 QT Interval:  347 QTC Calculation: 408 R Axis:   48 Text Interpretation:  Sinus rhythm Borderline T abnormalities, diffuse leads No significant change since last tracing Confirmed by Shaune PollackIsaacs, Kourtland Coopman [id number]) on 12/05/2018 11:59:31 PM   Radiology Dg Chest 2 View  Result Date: 12/06/2018 CLINICAL DATA:  Chest pain EXAM: CHEST - 2 VIEW COMPARISON:  10/26/2018 FINDINGS: The heart size and mediastinal contours are within normal limits. Both lungs are clear. The visualized skeletal structures are unremarkable. IMPRESSION: No active cardiopulmonary disease. Electronically Signed   By: Deatra RobinsonKevin  Herman M.D.   On: 12/06/2018 00:47    Procedures Ultrasound ED Peripheral IV (Provider) Date/Time: 12/06/2018 2:22 AM Performed by: Shaune PollackIsaacs, Nykia Turko, MD Authorized by: Shaune PollackIsaacs, Kacy Hegna, MD   Procedure details:    Indications: multiple failed IV attempts and poor IV access     Skin Prep: chlorhexidine gluconate     Location:  Left anterior forearm   Angiocath:  20 G   Bedside Ultrasound Guided: Yes     Images: archived     Patient tolerated procedure without complications: Yes     Dressing applied: Yes     (including critical care time)  Medications Ordered in ED Medications  sodium chloride 0.9 % bolus 500 mL (500 mLs Intravenous New Bag/Given 12/06/18 0219)      Initial Impression /  Assessment and Plan / ED Course  I have reviewed the triage vital signs and the nursing notes.  Pertinent labs & imaging results that were available during my care of the patient were reviewed by me and considered in my medical decision making (see chart for details).  Clinical Course as of Dec 06 246  Wed Dec 06, 2018  0141 Reviewed by me. Normal. No signs of CHF, PNA, PTX.  DG Chest 2 View [CI]  0142 No ischemic changes.  EKG 12-Lead [CI]  0154 Mild leukocytosis, non-specific. No left shift noted. No fever. Doubt inefction.  CBC with Differential(!) [CI]  0154 Very reassuring w/ constant sx >6 hours - doubt ACS. Doubt pericarditis/myocarditis based on EKG, sx, and neg trop.  Troponin I - ONCE - STAT [CI]  0221 Reassuring and pt does not appear hypervolemic clinically. Pt actually appears mildly dehydrated and admits to poor PO intake today 22/ being busy.  Brain natriuretic peptide [CI]  0234 Reassuring and pt has been compliant with her Xarelto.  D-dimer, quantitative (not at [facility]) [CI]  0238 Reassuring CMP - no signs to suggest intra-abd pathology. Renal function @ baseline.  Comprehensive metabolic panel(!) [CI]    Clinical Course User Index [CI] Shaune Pollack, MD       43 yo F with PMHx as above here with very atypical, likely MSk chest pain. Labs, imaging as above. Pain constant >6 hour with neg trop, doubt ACS. No signs of DVT/PE and D-Dimer neg. Pain not c/w dissection. Less likely GI/GERD. Will d/c with outpt follow-up. Hold NSAIDs in setting of Xarelto use.  Final Clinical Impressions(s) / ED Diagnoses   Final diagnoses:  Atypical chest pain    ED Discharge Orders         Ordered    methocarbamol (ROBAXIN) 500 MG tablet  Every 8 hours PRN     12/06/18 0237           Shaune Pollack, MD 12/06/18 [phone]

## 2019-01-24 ENCOUNTER — Other Ambulatory Visit: Payer: Self-pay

## 2019-01-24 ENCOUNTER — Emergency Department (HOSPITAL_BASED_OUTPATIENT_CLINIC_OR_DEPARTMENT_OTHER)
Admission: EM | Admit: 2019-01-24 | Discharge: 2019-01-24 | Disposition: A | Discharge status: Home / Self Care | Payer: Medicaid Other | Attending: Emergency Medicine | Admitting: Emergency Medicine

## 2019-01-24 ENCOUNTER — Encounter (HOSPITAL_BASED_OUTPATIENT_CLINIC_OR_DEPARTMENT_OTHER): Payer: Self-pay

## 2019-01-24 DIAGNOSIS — J449 Chronic obstructive pulmonary disease, unspecified: Secondary | ICD-10-CM | POA: Diagnosis not present

## 2019-01-24 DIAGNOSIS — Z79899 Other long term (current) drug therapy: Secondary | ICD-10-CM | POA: Diagnosis not present

## 2019-01-24 DIAGNOSIS — Z7982 Long term (current) use of aspirin: Secondary | ICD-10-CM | POA: Diagnosis not present

## 2019-01-24 DIAGNOSIS — I509 Heart failure, unspecified: Secondary | ICD-10-CM | POA: Diagnosis not present

## 2019-01-24 DIAGNOSIS — F17210 Nicotine dependence, cigarettes, uncomplicated: Secondary | ICD-10-CM | POA: Diagnosis not present

## 2019-01-24 DIAGNOSIS — G43809 Other migraine, not intractable, without status migrainosus: Secondary | ICD-10-CM | POA: Insufficient documentation

## 2019-01-24 DIAGNOSIS — I110 Hypertensive heart disease with heart failure: Secondary | ICD-10-CM | POA: Insufficient documentation

## 2019-01-24 DIAGNOSIS — R51 Headache: Secondary | ICD-10-CM | POA: Diagnosis present

## 2019-01-24 DIAGNOSIS — E119 Type 2 diabetes mellitus without complications: Secondary | ICD-10-CM | POA: Insufficient documentation

## 2019-01-24 DIAGNOSIS — Z7901 Long term (current) use of anticoagulants: Secondary | ICD-10-CM | POA: Insufficient documentation

## 2019-01-24 DIAGNOSIS — R519 Headache, unspecified: Secondary | ICD-10-CM

## 2019-01-24 MED ORDER — Medication
1.00 | Status: DC
Start: 2019-01-22 — End: 2019-01-24

## 2019-01-24 MED ORDER — CYCLOBENZAPRINE HCL 10 MG PO TABS
10.0000 mg | ORAL_TABLET | Freq: Once | ORAL | Status: AC
  Administered 2019-01-24: 19:00:00 10 mg via ORAL
  Filled 2019-01-24: qty 1

## 2019-01-24 MED ORDER — DIPHENHYDRAMINE HCL 50 MG/ML IJ SOLN
25.0000 mg | Freq: Once | INTRAMUSCULAR | Status: AC
  Administered 2019-01-24: 19:00:00 25 mg via INTRAVENOUS
  Filled 2019-01-24: qty 1

## 2019-01-24 MED ORDER — DEXAMETHASONE SODIUM PHOSPHATE 10 MG/ML IJ SOLN
10.0000 mg | Freq: Once | INTRAMUSCULAR | Status: AC
  Administered 2019-01-24: 10 mg via INTRAVENOUS
  Filled 2019-01-24: qty 1

## 2019-01-24 MED ORDER — ARMOUR THYROID 30 MG PO TABS
2.00 | ORAL_TABLET | ORAL | Status: DC
Start: ? — End: 2019-01-24

## 2019-01-24 MED ORDER — TROJAN ULTRA TEXTURE LUBRICATE MISC
6.25 | Status: DC
Start: 2019-01-23 — End: 2019-01-24

## 2019-01-24 MED ORDER — PROCHLORPERAZINE EDISYLATE 10 MG/2ML IJ SOLN
10.0000 mg | Freq: Once | INTRAMUSCULAR | Status: AC
  Administered 2019-01-24: 19:00:00 10 mg via INTRAVENOUS
  Filled 2019-01-24: qty 2

## 2019-01-24 MED ORDER — FOSPHENYTOIN SODIUM 50 MG PE/ML IJ SOLN
15.00 | INTRAMUSCULAR | Status: DC
Start: ? — End: 2019-01-24

## 2019-01-24 MED ORDER — VICON FORTE PO CAPS
17.00 | ORAL_CAPSULE | ORAL | Status: DC
Start: 2019-01-23 — End: 2019-01-24

## 2019-01-24 MED ORDER — R-TANNIC-S A/D 30-5 MG/5ML PO SUSP
100.00 | ORAL | Status: DC
Start: 2019-01-22 — End: 2019-01-24

## 2019-01-24 MED ORDER — EZ-SORB UNDERPAD MISC
15.00 | Status: DC
Start: 2019-01-23 — End: 2019-01-24

## 2019-01-24 MED ORDER — COMPOUND W FREEZE OFF EX AERO
2.00 | INHALATION_SPRAY | CUTANEOUS | Status: DC
Start: 2019-01-22 — End: 2019-01-24

## 2019-01-24 MED ORDER — DIHYDROTACHYSTEROL 0.125 MG PO TABS
20.00 | ORAL_TABLET | ORAL | Status: DC
Start: 2019-01-23 — End: 2019-01-24

## 2019-01-24 MED ORDER — STRI-DEX MAXIMUM STRENGTH 2 % EX PADS
125.00 | MEDICATED_PAD | CUTANEOUS | Status: DC
Start: ? — End: 2019-01-24

## 2019-01-24 MED ORDER — ASSURE II CONTROL LEVEL 1 & 2 VI
12.50 | Status: DC
Start: 2019-01-22 — End: 2019-01-24

## 2019-01-24 MED ORDER — BAYER WOMENS 81-300 MG PO TABS
40.00 | ORAL_TABLET | ORAL | Status: DC
Start: 2019-01-23 — End: 2019-01-24

## 2019-01-24 MED ORDER — Medication
10.00 | Status: DC
Start: 2019-01-23 — End: 2019-01-24

## 2019-01-24 MED ORDER — PRIMAQUINE PHOSPHATE POWD
1.00 | Status: DC
Start: ? — End: 2019-01-24

## 2019-01-24 MED ORDER — ASPIRIN-ACETAMINOPHEN-CAFFEINE 250-250-65 MG PO TABS
1.00 | ORAL_TABLET | ORAL | Status: DC
Start: ? — End: 2019-01-24

## 2019-01-24 MED ORDER — SODIUM CHLORIDE 0.9 % IV BOLUS
250.0000 mL | Freq: Once | INTRAVENOUS | Status: AC
  Administered 2019-01-24: 19:00:00 250 mL via INTRAVENOUS

## 2019-01-24 MED ORDER — GLUCOSAMINE-CHONDROIT-COLLAGEN PO
100.00 | ORAL | Status: DC
Start: 2019-01-23 — End: 2019-01-24

## 2019-01-24 MED ORDER — DUPLEX T 10 % EX SHAM
2.50 | MEDICATED_SHAMPOO | CUTANEOUS | Status: DC
Start: 2019-01-23 — End: 2019-01-24

## 2019-01-24 MED ORDER — Medication
Status: DC
Start: ? — End: 2019-01-24

## 2019-01-24 NOTE — ED Notes (Signed)
Pt requesting prescriptions be in print if able.

## 2019-01-24 NOTE — ED Provider Notes (Signed)
[facility] EMERGENCY DEPARTMENT Provider Note   CSN: [id number] Arrival date & time: 01/24/19  1827    History   Chief Complaint Chief Complaint  Patient presents with  . Headache    HPI Sonya Pennington is a 43 y.o. female.     HPI   Headache started Sunday Discharged from hospital Monday and it was dull [facility]) Monday night began to get worse, then yesterday continued to intensify Began to have pain right side, then occiput and down to the neck and shoulders feels tense Having light sensitivity, nausea, no vomiting Hx of migraines, was worse prior to starting beta blockers, but lately will have 3 day spouts with it Now is severe  No falls or trauma No numbness/weakness/no trouble walking/talking/visual changes Began slowly No fevers Similar to prior migraines  2L O2 at home BP low chronically   Past Medical History:  Diagnosis Date  . Asthma   . CHF (congestive heart failure) (HCC)   . COPD (chronic obstructive pulmonary disease) (HCC)   . Diabetes type 2, controlled (HCC)    significant weight loss  . Hypertension    improved after weight loss  . Obesity    lost 120 pounds with diet and exercise   . UTI (urinary tract infection)     There are no active problems to display for this patient.   Past Surgical History:  Procedure Laterality Date  . ABDOMINAL HYSTERECTOMY    . CARDIAC CATHETERIZATION    . CESAREAN SECTION    . TUBAL LIGATION       OB History   No obstetric history on file.      Home Medications    Prior to Admission medications   Medication Sig Start Date End Date Taking? Authorizing Provider  carvedilol (COREG) 6.25 MG tablet Take 6.25 mg by mouth 2 (two) times daily with a meal.   Yes [provider]  acetaminophen (TYLENOL) 500 MG tablet Take 1 tablet (500 mg total) by mouth every 6 (six) hours as needed. 11/30/15   Mady GemmaWestfall, Elizabeth C, PA-C  albuterol (PROVENTIL  HFA;VENTOLIN HFA) 108 (90 BASE) MCG/ACT inhaler Inhale 2 puffs into the lungs every 6 (six) hours as needed. For shortness of breath    [provider]  albuterol (PROVENTIL) (2.5 MG/3ML) 0.083% nebulizer solution Take 2.5 mg by nebulization every 6 (six) hours as needed. 11/24/11 04/08/15  Emeline GeneralHosmer, Kathleen, MD  albuterol (PROVENTIL) (2.5 MG/3ML) 0.083% nebulizer solution Take 3 mLs (2.5 mg total) by nebulization every 4 (four) hours as needed for wheezing or shortness of breath. 04/08/15   Pollina, Canary Brimhristopher J, MD  aspirin 81 MG tablet Take 81 mg by mouth daily.    [provider]  aspirin-acetaminophen-caffeine (EXCEDRIN MIGRAINE) [phone] MG per tablet Take 2 tablets by mouth every 6 (six) hours as needed. For migraine    [provider]  azithromycin (ZITHROMAX) 250 MG tablet Take 1 tablet (250 mg total) by mouth daily. Take first 2 tablets together, then 1 every day until finished. 01/29/15   Hess, Nada Boozerobyn M, PA-C  benzonatate (TESSALON) 100 MG capsule Take 1 capsule (100 mg total) by mouth every 8 (eight) hours. 10/26/18   Arby BarrettePfeiffer, Marcy, MD  carbamazepine (TEGRETOL-XR) 100 MG 12 hr tablet Take 1 tablet (100 mg total) by mouth 2 (two) times daily. 02/24/13   Carleene Cooperavidson, Alan, MD  chlorpheniramine-HYDROcodone [facility](TUSSIONEX PENNKINETIC ER) 10-8 MG/5ML [facility] Take 5 mLs by mouth every 12 (twelve) hours  as needed for cough. 07/24/13   Teressa LowerPickering, Vrinda, NP  chlorpheniramine-HYDROcodone (TUSSIONEX PENNKINETIC ER) 10-8 MG/5ML SUER Take 5 mLs by mouth every 12 (twelve) hours as needed for cough. 01/29/15   Hess, Nada Boozerobyn M, PA-C  doxycycline (VIBRAMYCIN) 100 MG capsule Take 1 capsule (100 mg total) by mouth 2 (two) times daily. 06/02/16   Pisciotta, Joni ReiningNicole, PA-C  furosemide (LASIX) 40 MG tablet Take 40 mg by mouth daily.    [provider]  guaiFENesin (MUCINEX) 600 MG 12 hr tablet Take 1 tablet (600 mg total) by mouth 2 (two) times daily. 10/26/18   Arby BarrettePfeiffer, Marcy, MD   guaiFENesin-codeine (CHERATUSSIN AC) 100-10 MG/5ML syrup Take 5 mLs by mouth 3 (three) times daily as needed for cough. 01/29/15   Hess, Nada Boozerobyn M, PA-C  HYDROcodone-acetaminophen (NORCO/VICODIN) 5-325 MG tablet Take 1-2 tablets by mouth every 6 hours as needed for pain. 06/02/16   Pisciotta, Joni ReiningNicole, PA-C  lisinopril (PRINIVIL,ZESTRIL) 5 MG tablet Take 5 mg by mouth daily.    [provider]  Magnesium Hydroxide (MAGNESIA PO) Take by mouth.    [provider]  methocarbamol (ROBAXIN) 500 MG tablet Take 1 tablet (500 mg total) by mouth every 8 (eight) hours as needed for muscle spasms (chest wall pain/muscle spasms). 12/06/18   Shaune PollackIsaacs, Cameron, MD  metolazone (ZAROXOLYN) 2.5 MG tablet Take 2.5 mg by mouth daily.    [provider]  metoprolol tartrate (LOPRESSOR) 25 MG tablet Take 25 mg by mouth 2 (two) times daily.    [provider]  nitrofurantoin, macrocrystal-monohydrate, (MACROBID) 100 MG capsule Take 1 capsule (100 mg total) by mouth 2 (two) times daily. 11/26/12   Elson AreasSofia, Leslie K, PA-C  OVER THE COUNTER MEDICATION Take 30 mLs by mouth daily as needed. For cough/cold symptoms   Cough/cold/severe allergy liquid    [provider]  oxyCODONE-acetaminophen (PERCOCET/ROXICET) 5-325 MG per tablet Take 2 tablets by mouth every 4 (four) hours as needed for pain. 12/20/12   Lorre NickAllen, Anthony, MD  Pantoprazole Sodium (PROTONIX PO) Take by mouth.    [provider]  polyethylene glycol (MIRALAX) packet Take 17 g by mouth daily. 11/26/12   Elson AreasSofia, Leslie K, PA-C  potassium chloride (K-DUR) 10 MEQ tablet Take 20 mEq by mouth 2 (two) times daily.    [provider]  predniSONE (DELTASONE) 20 MG tablet 2 tabs daily x 4 days 10/27/18   Arby BarrettePfeiffer, Marcy, MD  predniSONE (DELTASONE) 50 MG tablet Take 1 tablet (50 mg total) by mouth daily with breakfast. 06/12/15   Lawyer, Cristal Deerhristopher, PA-C  promethazine (PHENERGAN) 25 MG tablet Take 1 tablet (25 mg total) by mouth  every 6 (six) hours as needed for nausea. 02/01/13   Quita SkyeGhim, Michael, MD  promethazine-dextromethorphan (PROMETHAZINE-DM) 6.25-15 MG/5ML syrup Take 5 mLs by mouth 4 (four) times daily as needed for cough. 06/12/15   Lawyer, Cristal Deerhristopher, PA-C  Rivaroxaban (XARELTO) 15 MG TABS tablet Take 15 mg by mouth daily.    [provider]  spironolactone (ALDACTONE) 25 MG tablet Take 25 mg by mouth daily.    [provider]  sucralfate (CARAFATE) 1 G tablet Take 1 g by mouth 3 (three) times daily.    [provider]  TORSEMIDE PO Take by mouth.    [provider]  traMADol (ULTRAM) 50 MG tablet Take 1 tablet (50 mg total) by mouth every 6 (six) hours as needed for pain. 02/01/13   Quita SkyeGhim, Michael, MD    Family History Family History  Problem Relation Age  of Onset  . Cancer Mother   . COPD Mother   . Hypertension Mother   . Cancer Father   . Kidney disease Father   . Diabetes Father   . Hypertension Father     Social History Social History   Tobacco Use  . Smoking status: Current Every Day Smoker    Packs/day: 0.50  . Smokeless tobacco: Never Used  Substance Use Topics  . Alcohol use: No  . Drug use: Yes    Types: Marijuana     Allergies   Penicillins   Review of Systems Review of Systems  Constitutional: Positive for fatigue (chronic). Negative for fever.  HENT: Negative for sore throat.   Eyes: Negative for visual disturbance.  Respiratory: Negative for cough and shortness of breath.   Cardiovascular: Negative for chest pain.  Gastrointestinal: Negative for abdominal pain.  Genitourinary: Negative for difficulty urinating.  Musculoskeletal: Negative for back pain and neck pain.  Skin: Negative for rash.  Neurological: Positive for headaches. Negative for syncope, facial asymmetry, speech difficulty, weakness and numbness.     Physical Exam Updated Vital Signs BP 104/70   Pulse 84   Temp 98.1 F (36.7 C) (Oral)   Resp 18   Ht 5\' 4"  (1.626  m)   Wt 108.9 kg   SpO2 96%   BMI 41.20 kg/m   Physical Exam Vitals signs and nursing note reviewed.  Constitutional:      General: She is not in acute distress.    Appearance: She is well-developed. She is not diaphoretic.  HENT:     Head: Normocephalic and atraumatic.  Eyes:     General: No visual field deficit.    Conjunctiva/sclera: Conjunctivae normal.  Neck:     Musculoskeletal: Normal range of motion.  Cardiovascular:     Rate and Rhythm: Normal rate and regular rhythm.     Heart sounds: Normal heart sounds. No murmur. No friction rub. No gallop.   Pulmonary:     Effort: Pulmonary effort is normal. No respiratory distress.     Breath sounds: Normal breath sounds. No wheezing or rales.  Abdominal:     General: There is no distension.     Palpations: Abdomen is soft.     Tenderness: There is no abdominal tenderness. There is no guarding.  Musculoskeletal:        General: No tenderness.  Skin:    General: Skin is warm and dry.     Findings: No erythema or rash.  Neurological:     Mental Status: She is alert and oriented to person, place, and time.     GCS: GCS eye subscore is 4. GCS verbal subscore is 5. GCS motor subscore is 6.     Cranial Nerves: Cranial nerves are intact. No dysarthria or facial asymmetry.     Sensory: Sensation is intact.     Motor: Motor function is intact. No weakness.     Coordination: Coordination is intact. Coordination normal.      ED Treatments / Results  Labs (all labs ordered are listed, but only abnormal results are displayed) Labs Reviewed - No data to display  EKG None  Radiology No results found.  Procedures Procedures (including critical care time)  Medications Ordered in ED Medications  prochlorperazine (COMPAZINE) injection 10 mg (10 mg Intravenous Given 01/24/19 1918)  diphenhydrAMINE (BENADRYL) injection 25 mg (25 mg Intravenous Given 01/24/19 1918)  dexamethasone (DECADRON) injection 10 mg (10 mg Intravenous Given  01/24/19 1918)  sodium chloride 0.9 %  bolus 250 mL (0 mLs Intravenous Stopped 01/24/19 2052)  cyclobenzaprine (FLEXERIL) tablet 10 mg (10 mg Oral Given 01/24/19 1918)     Initial Impression / Assessment and Plan / ED Course  I have reviewed the triage vital signs and the nursing notes.  Pertinent labs & imaging results that were available during my care of the patient were reviewed by me and considered in my medical decision making (see chart for details).        43 year old female with a history of CHF and COPD on 2 L of oxygen, diabetes, hypertension, presents with concern for headache.  Headache began slowly, no trauma, no fevers, and normal neurologic exam and have low suspicion for [facility], SDH or meningitis.  Headache similar to prior HA, has typical migraine features and some trapezius tenderness that may also have tension component. Patient was given decadron, flexeril, compazine and benadryl with improvement in headache.  Patient discharged in stable condition with understanding of reasons to return.    Final Clinical Impressions(s) / ED Diagnoses   Final diagnoses:  Acute nonintractable headache, unspecified headache type  Other migraine without status migrainosus, not intractable    ED Discharge Orders    None       Alvira MondaySchlossman, Kelsha Older, MD 01/25/19 1332

## 2019-01-24 NOTE — ED Triage Notes (Signed)
Pt c/o HA since Monday after d/c in hospital. Light sensitivity, nausea. Pt also reports the pain has since radiated down into her neck and shoulders.

## 2019-03-27 ENCOUNTER — Emergency Department (HOSPITAL_BASED_OUTPATIENT_CLINIC_OR_DEPARTMENT_OTHER)
Admission: EM | Admit: 2019-03-27 | Discharge: 2019-03-28 | Disposition: A | Discharge status: Home / Self Care | Payer: Medicaid Other | Attending: Emergency Medicine | Admitting: Emergency Medicine

## 2019-03-27 ENCOUNTER — Encounter (HOSPITAL_BASED_OUTPATIENT_CLINIC_OR_DEPARTMENT_OTHER): Payer: Self-pay | Admitting: *Deleted

## 2019-03-27 ENCOUNTER — Other Ambulatory Visit: Payer: Self-pay

## 2019-03-27 DIAGNOSIS — I509 Heart failure, unspecified: Secondary | ICD-10-CM | POA: Insufficient documentation

## 2019-03-27 DIAGNOSIS — Z7901 Long term (current) use of anticoagulants: Secondary | ICD-10-CM | POA: Insufficient documentation

## 2019-03-27 DIAGNOSIS — L03114 Cellulitis of left upper limb: Secondary | ICD-10-CM | POA: Diagnosis present

## 2019-03-27 DIAGNOSIS — J449 Chronic obstructive pulmonary disease, unspecified: Secondary | ICD-10-CM | POA: Diagnosis not present

## 2019-03-27 DIAGNOSIS — L02414 Cutaneous abscess of left upper limb: Secondary | ICD-10-CM | POA: Insufficient documentation

## 2019-03-27 DIAGNOSIS — L03119 Cellulitis of unspecified part of limb: Secondary | ICD-10-CM

## 2019-03-27 DIAGNOSIS — E119 Type 2 diabetes mellitus without complications: Secondary | ICD-10-CM | POA: Diagnosis not present

## 2019-03-27 DIAGNOSIS — Z7982 Long term (current) use of aspirin: Secondary | ICD-10-CM | POA: Insufficient documentation

## 2019-03-27 DIAGNOSIS — I110 Hypertensive heart disease with heart failure: Secondary | ICD-10-CM | POA: Insufficient documentation

## 2019-03-27 DIAGNOSIS — L02419 Cutaneous abscess of limb, unspecified: Secondary | ICD-10-CM

## 2019-03-27 NOTE — ED Triage Notes (Signed)
Spider bite to her left forearm 5 days ago. She was seen by her MD and told to use a topical ointment. She was started on antibiotics at [facility] 3 days ago. She was seen at [facility] the next day and started on more antibiotics. She is not getting better.

## 2019-03-27 NOTE — ED Notes (Signed)
Erythematous area to left anterior forearm, with redness spreading. PT was placed on 2 new antibiotics today.

## 2019-03-28 LAB — CBC WITH DIFFERENTIAL/PLATELET
Abs Immature Granulocytes: 0.05 10*3/uL (ref 0.00–0.07)
Basophils Absolute: 0 10*3/uL (ref 0.0–0.1)
Basophils Relative: 0 %
Eosinophils Absolute: 0 10*3/uL (ref 0.0–0.5)
Eosinophils Relative: 0 %
HCT: 40.8 % (ref 36.0–46.0)
Hemoglobin: 13.2 g/dL (ref 12.0–15.0)
Immature Granulocytes: 0 %
Lymphocytes Relative: 20 %
Lymphs Abs: 2.4 10*3/uL (ref 0.7–4.0)
MCH: 29.7 pg (ref 26.0–34.0)
MCHC: 32.4 g/dL (ref 30.0–36.0)
MCV: 91.9 fL (ref 80.0–100.0)
Monocytes Absolute: 0.9 10*3/uL (ref 0.1–1.0)
Monocytes Relative: 8 %
Neutro Abs: 8.8 10*3/uL — ABNORMAL HIGH (ref 1.7–7.7)
Neutrophils Relative %: 72 %
Platelets: 178 10*3/uL (ref 150–400)
RBC: 4.44 MIL/uL (ref 3.87–5.11)
RDW: 13.2 % (ref 11.5–15.5)
WBC: 12.3 10*3/uL — ABNORMAL HIGH (ref 4.0–10.5)
nRBC: 0 % (ref 0.0–0.2)

## 2019-03-28 LAB — BASIC METABOLIC PANEL
Anion gap: 8 (ref 5–15)
BUN: 14 mg/dL (ref 6–20)
CO2: 30 mmol/L (ref 22–32)
Calcium: 9.2 mg/dL (ref 8.9–10.3)
Chloride: 97 mmol/L — ABNORMAL LOW (ref 98–111)
Creatinine, Ser: 1.01 mg/dL — ABNORMAL HIGH (ref 0.44–1.00)
GFR calc Af Amer: >60 mL/min (ref >60)
GFR calc non Af Amer: >60 mL/min (ref >60)
Glucose, Bld: 102 mg/dL — ABNORMAL HIGH (ref 70–99)
Potassium: 4.1 mmol/L (ref 3.5–5.1)
Sodium: 135 mmol/L (ref 135–145)

## 2019-03-28 MED ORDER — LIDOCAINE-EPINEPHRINE (PF) 2 %-1:200000 IJ SOLN
20.0000 mL | Freq: Once | INTRAMUSCULAR | Status: DC
  Filled 2019-03-28: qty 20

## 2019-03-28 MED ORDER — VANCOMYCIN HCL IN DEXTROSE 1-5 GM/200ML-% IV SOLN
1000.0000 mg | Freq: Once | INTRAVENOUS | Status: AC
  Administered 2019-03-28: 1000 mg via INTRAVENOUS
  Filled 2019-03-28: qty 200

## 2019-03-28 MED ORDER — LIDOCAINE-EPINEPHRINE 1 %-1:100000 IJ SOLN
INTRAMUSCULAR | Status: AC
  Administered 2019-03-28: 02:00:00 1 mL
  Filled 2019-03-28: qty 1

## 2019-03-28 MED ORDER — SODIUM CHLORIDE 0.9 % IV SOLN
1.0000 g | Freq: Once | INTRAVENOUS | Status: DC
  Filled 2019-03-28: qty 10

## 2019-03-28 NOTE — ED Provider Notes (Signed)
CHIEF COMPLAINT: Abscess and cellulitis  HPI: Patient is a 43 year old female with history of PE on Xarelto, hypertension and diabetes that resolved after weight loss, COPD, CHF, asthma who presents to the emergency department with complaints of abscess and cellulitis to the left forearm.  Patient states she thinks she was bit by a spider on 03/22/2019.  Was seen by her primary care provider on 03/23/2019 and was started on Bactroban.  Symptoms did not improve and she went to the emergency department at [facility] on 03/24/2019.  She was started on Bactrim at that time.  Was seen at urgent care on 03/25/2019.  It appeared at that time she was only taken the Bactrim once a day.  They instructed her to take it twice a day and prescribed tramadol for pain control.  Was then seen in the office again on 03/27/2019 and was changed to clindamycin and Cipro.  Is here tonight because she states that it is not getting better.  She denies fevers, chills, nausea, vomiting, diarrhea.  She states she no longer has a history of diabetes.  ROS: See HPI Constitutional: no fever  Eyes: no drainage  ENT: no runny nose   Cardiovascular:  no chest pain  Resp: no SOB  GI: no vomiting GU: no dysuria Integumentary: no rash  Allergy: no hives  Musculoskeletal: no leg swelling  Neurological: no slurred speech ROS otherwise negative  PAST MEDICAL HISTORY/PAST SURGICAL HISTORY:  Past Medical History:  Diagnosis Date  . Asthma   . CHF (congestive heart failure) (HCC)   . COPD (chronic obstructive pulmonary disease) (HCC)   . Diabetes type 2, controlled (HCC)    significant weight loss  . Hypertension    improved after weight loss  . Obesity    lost 120 pounds with diet and exercise   . UTI (urinary tract infection)     MEDICATIONS:  Prior to Admission medications   Medication Sig Start Date End Date Taking? Authorizing Provider  acetaminophen (TYLENOL) 500 MG tablet Take 1 tablet (500 mg total) by mouth every 6 (six)  hours as needed. 11/30/15   Mady GemmaWestfall, Elizabeth C, PA-C  albuterol (PROVENTIL HFA;VENTOLIN HFA) 108 (90 BASE) MCG/ACT inhaler Inhale 2 puffs into the lungs every 6 (six) hours as needed. For shortness of breath    [provider]  albuterol (PROVENTIL) (2.5 MG/3ML) 0.083% nebulizer solution Take 2.5 mg by nebulization every 6 (six) hours as needed. 11/24/11 04/08/15  Emeline GeneralHosmer, Kathleen, MD  albuterol (PROVENTIL) (2.5 MG/3ML) 0.083% nebulizer solution Take 3 mLs (2.5 mg total) by nebulization every 4 (four) hours as needed for wheezing or shortness of breath. 04/08/15   Pollina, Canary Brimhristopher J, MD  aspirin 81 MG tablet Take 81 mg by mouth daily.    [provider]  aspirin-acetaminophen-caffeine (EXCEDRIN MIGRAINE) [phone] MG per tablet Take 2 tablets by mouth every 6 (six) hours as needed. For migraine    [provider]  azithromycin (ZITHROMAX) 250 MG tablet Take 1 tablet (250 mg total) by mouth daily. Take first 2 tablets together, then 1 every day until finished. 01/29/15   Hess, Nada Boozerobyn M, PA-C  benzonatate (TESSALON) 100 MG capsule Take 1 capsule (100 mg total) by mouth every 8 (eight) hours. 10/26/18   Arby BarrettePfeiffer, Marcy, MD  carbamazepine (TEGRETOL-XR) 100 MG 12 hr tablet Take 1 tablet (100 mg total) by mouth 2 (two) times daily. 02/24/13   Carleene Cooperavidson, Alan, MD  carvedilol (COREG) 6.25 MG tablet Take 6.25 mg by mouth 2 (two) times daily  with a meal.    [provider]  chlorpheniramine-HYDROcodone (TUSSIONEX PENNKINETIC ER) 10-8 MG/5ML LQCR Take 5 mLs by mouth every 12 (twelve) hours as needed for cough. 07/24/13   Teressa LowerPickering, Vrinda, NP  chlorpheniramine-HYDROcodone (TUSSIONEX PENNKINETIC ER) 10-8 MG/5ML SUER Take 5 mLs by mouth every 12 (twelve) hours as needed for cough. 01/29/15   Hess, Nada Boozerobyn M, PA-C  doxycycline (VIBRAMYCIN) 100 MG capsule Take 1 capsule (100 mg total) by mouth 2 (two) times daily. 06/02/16   Pisciotta, Joni ReiningNicole, PA-C  furosemide (LASIX) 40 MG tablet Take  40 mg by mouth daily.    [provider]  guaiFENesin (MUCINEX) 600 MG 12 hr tablet Take 1 tablet (600 mg total) by mouth 2 (two) times daily. 10/26/18   Arby BarrettePfeiffer, Marcy, MD  guaiFENesin-codeine (CHERATUSSIN AC) 100-10 MG/5ML syrup Take 5 mLs by mouth 3 (three) times daily as needed for cough. 01/29/15   Hess, Nada Boozerobyn M, PA-C  HYDROcodone-acetaminophen (NORCO/VICODIN) 5-325 MG tablet Take 1-2 tablets by mouth every 6 hours as needed for pain. 06/02/16   Pisciotta, Joni ReiningNicole, PA-C  lisinopril (PRINIVIL,ZESTRIL) 5 MG tablet Take 5 mg by mouth daily.    [provider]  Magnesium Hydroxide (MAGNESIA PO) Take by mouth.    [provider]  methocarbamol (ROBAXIN) 500 MG tablet Take 1 tablet (500 mg total) by mouth every 8 (eight) hours as needed for muscle spasms (chest wall pain/muscle spasms). 12/06/18   Shaune PollackIsaacs, Cameron, MD  metolazone (ZAROXOLYN) 2.5 MG tablet Take 2.5 mg by mouth daily.    [provider]  metoprolol tartrate (LOPRESSOR) 25 MG tablet Take 25 mg by mouth 2 (two) times daily.    [provider]  nitrofurantoin, macrocrystal-monohydrate, (MACROBID) 100 MG capsule Take 1 capsule (100 mg total) by mouth 2 (two) times daily. 11/26/12   Elson AreasSofia, Leslie K, PA-C  OVER THE COUNTER MEDICATION Take 30 mLs by mouth daily as needed. For cough/cold symptoms   Cough/cold/severe allergy liquid    [provider]  oxyCODONE-acetaminophen (PERCOCET/ROXICET) 5-325 MG per tablet Take 2 tablets by mouth every 4 (four) hours as needed for pain. 12/20/12   Lorre NickAllen, Anthony, MD  Pantoprazole Sodium (PROTONIX PO) Take by mouth.    [provider]  polyethylene glycol (MIRALAX) packet Take 17 g by mouth daily. 11/26/12   Elson AreasSofia, Leslie K, PA-C  potassium chloride (K-DUR) 10 MEQ tablet Take 20 mEq by mouth 2 (two) times daily.    [provider]  predniSONE (DELTASONE) 20 MG tablet 2 tabs daily x 4 days 10/27/18   Arby BarrettePfeiffer, Marcy, MD  predniSONE (DELTASONE)  50 MG tablet Take 1 tablet (50 mg total) by mouth daily with breakfast. 06/12/15   Lawyer, Cristal Deerhristopher, PA-C  promethazine (PHENERGAN) 25 MG tablet Take 1 tablet (25 mg total) by mouth every 6 (six) hours as needed for nausea. 02/01/13   Quita SkyeGhim, Michael, MD  promethazine-dextromethorphan (PROMETHAZINE-DM) 6.25-15 MG/5ML syrup Take 5 mLs by mouth 4 (four) times daily as needed for cough. 06/12/15   Lawyer, Cristal Deerhristopher, PA-C  Rivaroxaban (XARELTO) 15 MG TABS tablet Take 15 mg by mouth daily.    [provider]  spironolactone (ALDACTONE) 25 MG tablet Take 25 mg by mouth daily.    [provider]  sucralfate (CARAFATE) 1 G tablet Take 1 g by mouth 3 (three) times daily.    [provider]  TORSEMIDE PO Take by mouth.    [provider]  traMADol (ULTRAM) 50 MG tablet Take 1 tablet (50 mg total) by  mouth every 6 (six) hours as needed for pain. 02/01/13   Kingsley Spittle, MD    ALLERGIES:  Allergies  Allergen Reactions  . Penicillins Anaphylaxis    SOCIAL HISTORY:  Social History   Tobacco Use  . Smoking status: Current Every Day Smoker    Packs/day: 0.50  . Smokeless tobacco: Never Used  Substance Use Topics  . Alcohol use: No    FAMILY HISTORY: Family History  Problem Relation Age of Onset  . Cancer Mother   . COPD Mother   . Hypertension Mother   . Cancer Father   . Kidney disease Father   . Diabetes Father   . Hypertension Father     EXAM: BP 129/78   Pulse 87   Temp 98.3 F (36.8 C) (Oral)   Resp 20   Ht 5\' 4"  (1.626 m)   Wt 110.2 kg   SpO2 98%   BMI 41.71 kg/m  CONSTITUTIONAL: Alert and oriented and responds appropriately to questions.  Obese, chronically ill-appearing, in no distress HEAD: Normocephalic EYES: Conjunctivae clear, pupils appear equal, EOMI ENT: normal nose; moist mucous membranes NECK: Supple, no meningismus, no nuchal rigidity, no LAD  CARD: RRR; S1 and S2 appreciated; no murmurs, no clicks, no rubs, no  gallops RESP: Normal chest excursion without splinting or tachypnea; breath sounds clear and equal bilaterally; no wheezes, no rhonchi, no rales, no hypoxia or respiratory distress, speaking full sentences ABD/GI: Normal bowel sounds; non-distended; soft, non-tender, no rebound, no guarding, no peritoneal signs, no hepatosplenomegaly BACK:  The back appears normal and is non-tender to palpation, there is no CVA tenderness EXT: Normal ROM in all joints; non-tender to palpation; no edema; normal capillary refill; no cyanosis, no calf tenderness or swelling; patient has a 2 x 2 centimeter red raised area to the mid left forearm with ulceration in the center without drainage.  There is some surrounding fluctuance and induration.  She does have approximately 4 x 7 cm of surrounding redness and warmth.  She is a 2+ left radial pulse.    SKIN: Normal color for age and race; warm; no rash NEURO: Moves all extremities equally PSYCH: The patient's mood and manner are appropriate. Grooming and personal hygiene are appropriate.  MEDICAL DECISION MAKING: Patient here with abscess and cellulitis.  Discussed with patient that I feel the reason this is not getting better despite antibiotics is because she needs the abscess drained.  She agrees to incision and drainage here in the emergency department.  Will give IV vancomycin for broad coverage of staph and strep, will send wound culture and obtain labs today.  ED PROGRESS: Patient tolerated incision and drainage well and had a decent amount of purulent drainage come from the abscess.  Have recommended warm compresses and warm soaks to this area.  Have recommended she continue her clindamycin and Cipro for broad coverage and follow-up with her PCP.  We discussed that it will take time for her symptoms to resolve but if it looks like the redness and warmth is spreading outside of the lines that we have marked with a tissue marker today where she develop systemic symptoms  such as fevers, vomiting that she should return to the emergency department.  I do not feel she needs admission at this time.  Her labs do show mild leukocytosis but are otherwise unremarkable.  At this time, I do not feel there is any life-threatening condition present. I have reviewed and discussed all results (EKG, imaging, lab, urine as appropriate)  and exam findings with patient/family. I have reviewed nursing notes and appropriate previous records.  I feel the patient is safe to be discharged home without further emergent workup and can continue workup as an outpatient as needed. Discussed usual and customary return precautions. Patient/family verbalize understanding and are comfortable with this plan.  Outpatient follow-up has been provided as needed. All questions have been answered.     INCISION AND DRAINAGE Performed by: Baxter HireKristen Ward Consent: Verbal consent obtained. Risks and benefits: risks, benefits and alternatives were discussed Type: abscess  Body area: Left forearm  Anesthesia: local infiltration  Incision was made with a scalpel.  Local anesthetic: lidocaine 2 % with epinephrine  Anesthetic total: 10 ml  Complexity: complex Blunt dissection to break up loculations  Drainage: purulent  Drainage amount: Moderate   Patient tolerance: Patient tolerated the procedure well with no immediate complications.      Ward, Layla MawKristen N, DO 03/28/19 [phone]

## 2019-03-28 NOTE — Discharge Instructions (Addendum)
You may alternate Tylenol 1000 mg every 6 hours as needed for pain.  You may continue your tramadol as needed for pain.  Please continue your clindamycin and Cipro as prescribed.  Please continue to use warm compresses to this area and/or soak this arm in warm water several times a day to help keep the incision open to allow for drainage.   Please follow-up with your primary care physician if symptoms are not improving.

## 2019-03-30 LAB — AEROBIC CULTURE W GRAM STAIN (SUPERFICIAL SPECIMEN)

## 2019-03-30 LAB — AEROBIC CULTURE? (SUPERFICIAL SPECIMEN)

## 2019-03-31 ENCOUNTER — Telehealth: Payer: Self-pay | Admitting: Emergency Medicine

## 2019-03-31 NOTE — Telephone Encounter (Signed)
Post ED Visit - Positive Culture Follow-up  Culture report reviewed by antimicrobial stewardship pharmacist: [facility] Team []  Elenor Quinones, Pharm.D. []  Heide Guile, Pharm.D., BCPS AQ-ID []  Parks Neptune, Pharm.D., BCPS []  Alycia Rossetti, Pharm.D., BCPS []  Ryan, [place].D., BCPS, AAHIVP []  Legrand Como, Pharm.D., BCPS, AAHIVP []  Salome Arnt, PharmD, BCPS []  Johnnette Gourd, PharmD, BCPS []  Hughes Better, PharmD, BCPS [x]  Elicia Lamp, PharmD []  Laqueta Linden, PharmD, BCPS []  Albertina Parr, PharmD  [place] Team []  Leodis Sias, PharmD []  Lindell Spar, PharmD []  Royetta Asal, PharmD []  Graylin Shiver, Rph []  Rema Fendt) Glennon Mac, PharmD []  Arlyn Dunning, PharmD []  Netta Cedars, PharmD []  Dia Sitter, PharmD []  Leone Haven, PharmD []  Gretta Arab, PharmD []  Theodis Shove, PharmD []  Peggyann Juba, PharmD []  Reuel Boom, PharmD   Positive aerobic culture Treated with Clindamycin, organism sensitive to the same and no further patient follow-up is required at this time.  Sandi Raveling Dennise Bamber 03/31/2019, 1:28 PM

## 2020-08-23 ENCOUNTER — Emergency Department (HOSPITAL_BASED_OUTPATIENT_CLINIC_OR_DEPARTMENT_OTHER)
Admission: EM | Admit: 2020-08-23 | Discharge: 2020-08-23 | Disposition: A | Discharge status: Home / Self Care | Payer: Medicaid Other | Attending: Emergency Medicine | Admitting: Emergency Medicine

## 2020-08-23 ENCOUNTER — Other Ambulatory Visit: Payer: Self-pay

## 2020-08-23 ENCOUNTER — Encounter (HOSPITAL_BASED_OUTPATIENT_CLINIC_OR_DEPARTMENT_OTHER): Payer: Self-pay | Admitting: Emergency Medicine

## 2020-08-23 ENCOUNTER — Emergency Department (HOSPITAL_BASED_OUTPATIENT_CLINIC_OR_DEPARTMENT_OTHER): Payer: Medicaid Other

## 2020-08-23 DIAGNOSIS — Z7901 Long term (current) use of anticoagulants: Secondary | ICD-10-CM | POA: Insufficient documentation

## 2020-08-23 DIAGNOSIS — I110 Hypertensive heart disease with heart failure: Secondary | ICD-10-CM | POA: Diagnosis not present

## 2020-08-23 DIAGNOSIS — J441 Chronic obstructive pulmonary disease with (acute) exacerbation: Secondary | ICD-10-CM

## 2020-08-23 DIAGNOSIS — E119 Type 2 diabetes mellitus without complications: Secondary | ICD-10-CM | POA: Diagnosis not present

## 2020-08-23 DIAGNOSIS — R0602 Shortness of breath: Secondary | ICD-10-CM

## 2020-08-23 DIAGNOSIS — I509 Heart failure, unspecified: Secondary | ICD-10-CM | POA: Insufficient documentation

## 2020-08-23 DIAGNOSIS — F17200 Nicotine dependence, unspecified, uncomplicated: Secondary | ICD-10-CM | POA: Diagnosis not present

## 2020-08-23 DIAGNOSIS — R5383 Other fatigue: Secondary | ICD-10-CM

## 2020-08-23 DIAGNOSIS — Z20822 Contact with and (suspected) exposure to covid-19: Secondary | ICD-10-CM | POA: Insufficient documentation

## 2020-08-23 DIAGNOSIS — J45909 Unspecified asthma, uncomplicated: Secondary | ICD-10-CM | POA: Insufficient documentation

## 2020-08-23 DIAGNOSIS — Z79899 Other long term (current) drug therapy: Secondary | ICD-10-CM | POA: Diagnosis not present

## 2020-08-23 LAB — COMPREHENSIVE METABOLIC PANEL
ALT: 18 U/L (ref 0–44)
AST: 18 U/L (ref 15–41)
Albumin: 4 g/dL (ref 3.5–5.0)
Alkaline Phosphatase: 43 U/L (ref 38–126)
Anion gap: 12 (ref 5–15)
BUN: 9 mg/dL (ref 6–20)
CO2: 28 mmol/L (ref 22–32)
Calcium: 9 mg/dL (ref 8.9–10.3)
Chloride: 98 mmol/L (ref 98–111)
Creatinine, Ser: 0.74 mg/dL (ref 0.44–1.00)
GFR, Estimated: >60 mL/min (ref >60)
Glucose, Bld: 121 mg/dL — ABNORMAL HIGH (ref 70–99)
Potassium: 4.5 mmol/L (ref 3.5–5.1)
Sodium: 138 mmol/L (ref 135–145)
Total Bilirubin: 0.5 mg/dL (ref 0.3–1.2)
Total Protein: 6.9 g/dL (ref 6.5–8.1)

## 2020-08-23 LAB — CBC WITH DIFFERENTIAL/PLATELET
Abs Immature Granulocytes: 0.04 10*3/uL (ref 0.00–0.07)
Basophils Absolute: 0.1 10*3/uL (ref 0.0–0.1)
Basophils Relative: 1 %
Eosinophils Absolute: 0 10*3/uL (ref 0.0–0.5)
Eosinophils Relative: 0 %
HCT: 44.5 % (ref 36.0–46.0)
Hemoglobin: 15.2 g/dL — ABNORMAL HIGH (ref 12.0–15.0)
Immature Granulocytes: 1 %
Lymphocytes Relative: 24 %
Lymphs Abs: 2.1 10*3/uL (ref 0.7–4.0)
MCH: 31.6 pg (ref 26.0–34.0)
MCHC: 34.2 g/dL (ref 30.0–36.0)
MCV: 92.5 fL (ref 80.0–100.0)
Monocytes Absolute: 0.8 10*3/uL (ref 0.1–1.0)
Monocytes Relative: 9 %
Neutro Abs: 5.7 10*3/uL (ref 1.7–7.7)
Neutrophils Relative %: 65 %
Platelets: 244 10*3/uL (ref 150–400)
RBC: 4.81 MIL/uL (ref 3.87–5.11)
RDW: 12.7 % (ref 11.5–15.5)
WBC: 8.6 10*3/uL (ref 4.0–10.5)
nRBC: 0 % (ref 0.0–0.2)

## 2020-08-23 LAB — TROPONIN I (HIGH SENSITIVITY): Troponin I (High Sensitivity): 5 ng/L (ref <18)

## 2020-08-23 LAB — BRAIN NATRIURETIC PEPTIDE: B Natriuretic Peptide: 28.4 pg/mL (ref 0.0–100.0)

## 2020-08-23 LAB — RESP PANEL BY RT-PCR (FLU A&B, COVID) ARPGX2
Influenza A by PCR: NEGATIVE
Influenza B by PCR: NEGATIVE
SARS Coronavirus 2 by RT PCR: NEGATIVE

## 2020-08-23 MED ORDER — PREDNISONE 10 MG PO TABS: 40.0000 mg | ORAL_TABLET | Freq: Every day | ORAL | 0 refills | Status: AC

## 2020-08-23 MED ORDER — ACETAMINOPHEN 500 MG PO TABS
1000.0000 mg | ORAL_TABLET | Freq: Once | ORAL | Status: AC
  Administered 2020-08-23: 1000 mg via ORAL
  Filled 2020-08-23: qty 2

## 2020-08-23 NOTE — ED Notes (Signed)
RT assessed patient at triage. BBS slight exp.wheezes. Wearing her home 02 at 2L, SAT 95%. Placed on 2L on the wall.

## 2020-08-23 NOTE — ED Notes (Signed)
Pt O2 sat drops to low 80s while sleeping; she norally wears O2 at home to sleep and requests it now; O2 reapplied at 2L [place]; pt also requests Tylenol

## 2020-08-23 NOTE — ED Notes (Addendum)
Ambulated pt around track, w/o O2, maintained 95-97% SpO2, ambulated fine until we got halfway SPO2 88% and 131 HR, stopped and turned O2 back on 15-20 seconds.  Once we were ready to resume, turned O2 off and proceeded back to room, without incident. SpO2 98% and HR 75, when returning to room.  Felt fine upon entering room, but was SOB visibly stated "she has COPD".

## 2020-08-23 NOTE — ED Triage Notes (Addendum)
SOB for several days. Hx of COPD. Normally wears O2 at night but has been wearing it more often. Also c/o headache x 1 week. States she head butted her large dog. Is vaccinated against flu and covid

## 2020-08-23 NOTE — ED Provider Notes (Signed)
[facility] EMERGENCY DEPARTMENT Provider Note   CSN: [id number] Arrival date & time: 08/23/20  9937     History Chief Complaint  Patient presents with  . Shortness of Breath    Sonya Pennington is a 45 y.o. female.  HPI      45yo female with history of CHF< COPD  80lb dog hit her in head 1 week ago, son elbowed her accidentally while putting shirt on   Has COPD, when has exacerbation it feels wet but doesn't feel that way now, but got up to walk to refrigerator and felt short of breath with sats 70s Normally has to only wear it with sleep or long distance, but typically, will wear O2 to appointments but at home doesn't need to wear it  No energy, just feels rough, crappy, tired for no reason CHF hx, fatigue Low appetite, thinks it is ok but then tries to eat and just can't  2 days ago had full blow migraine which is improved but continuing mild headache Since a week ago has had worsening dyspnea Had COPD exacerbation previously, this feels different Sometimes feels wheezing but not as much as her COPD exacerbation, inhaler helped some,  Productive cough with wheezing  No chest pain, had brief with coughing fit yesterday.  Doesn't lay down flat. No swelling in legs. Has been coughing more but dry.  Nausea, no vomiting Throat raspy, mild runny nose This AM had constipation atlernates Sister in law   Past Medical History:  Diagnosis Date  . Asthma   . CHF (congestive heart failure) (HCC)   . COPD (chronic obstructive pulmonary disease) (HCC)   . Diabetes type 2, controlled (HCC)    significant weight loss  . Hypertension    improved after weight loss  . Obesity    lost 120 pounds with diet and exercise   . UTI (urinary tract infection)     There are no problems to display for this patient.   Past Surgical History:  Procedure Laterality Date  . ABDOMINAL HYSTERECTOMY    . CARDIAC CATHETERIZATION    . CESAREAN SECTION    . TUBAL LIGATION       OB  History   No obstetric history on file.     Family History  Problem Relation Age of Onset  . Cancer Mother   . COPD Mother   . Hypertension Mother   . Cancer Father   . Kidney disease Father   . Diabetes Father   . Hypertension Father     Social History   Tobacco Use  . Smoking status: Current Every Day Smoker    Packs/day: 0.50  . Smokeless tobacco: Never Used  Vaping Use  . Vaping Use: Never used  Substance Use Topics  . Alcohol use: No  . Drug use: Yes    Types: Marijuana    Home Medications Prior to Admission medications   Medication Sig Start Date End Date Taking? Authorizing Provider  albuterol (PROVENTIL HFA;VENTOLIN HFA) 108 (90 BASE) MCG/ACT inhaler Inhale 2 puffs into the lungs every 6 (six) hours as needed. For shortness of breath   Yes [provider]  carbamazepine (TEGRETOL-XR) 100 MG 12 hr tablet Take 1 tablet (100 mg total) by mouth 2 (two) times daily. Patient taking differently: Take 200 mg by mouth 2 (two) times daily. 02/24/13  Yes Carleene Cooper, MD  Pantoprazole Sodium (PROTONIX PO) Take by mouth.   Yes [provider]  predniSONE (DELTASONE) 10 MG tablet Take  4 tablets (40 mg total) by mouth daily for 4 days. 08/23/20 08/27/20 Yes Alvira Monday, MD  Rivaroxaban (XARELTO) 15 MG TABS tablet Take 15 mg by mouth daily.   Yes [provider]  spironolactone (ALDACTONE) 25 MG tablet Take 25 mg by mouth daily.   Yes [provider]  acetaminophen (TYLENOL) 500 MG tablet Take 1 tablet (500 mg total) by mouth every 6 (six) hours as needed. 11/30/15   Mady Gemma, PA-C  albuterol (PROVENTIL) (2.5 MG/3ML) 0.083% nebulizer solution Take 3 mLs (2.5 mg total) by nebulization every 4 (four) hours as needed for wheezing or shortness of breath. 04/08/15   Pollina, Canary Brim, MD  lisinopril (PRINIVIL,ZESTRIL) 5 MG tablet Take 5 mg by mouth daily.    [provider]  OVER THE COUNTER MEDICATION Take 30 mLs by mouth  daily as needed. For cough/cold symptoms   Cough/cold/severe allergy liquid    [provider]  TORSEMIDE PO Take by mouth.    [provider]    Allergies    Penicillins  Review of Systems   Review of Systems  Constitutional: Positive for appetite change and fatigue. Negative for fever.  HENT: Positive for sore throat.   Eyes: Negative for visual disturbance.  Respiratory: Positive for cough and shortness of breath.   Cardiovascular: Negative for chest pain.  Gastrointestinal: Negative for abdominal pain, nausea and vomiting.  Genitourinary: Negative for difficulty urinating.  Musculoskeletal: Negative for back pain and neck pain.  Skin: Negative for rash.  Neurological: Negative for syncope and headaches.    Physical Exam Updated Vital Signs BP 119/89   Pulse 68   Temp 97.8 F (36.6 C) (Oral)   Resp (!) 27   Ht 5\' 4"  (1.626 m)   Wt 95.3 kg   SpO2 94%   BMI 36.05 kg/m   Physical Exam Vitals and nursing note reviewed.  Constitutional:      General: She is not in acute distress.    Appearance: She is well-developed and well-nourished. She is not diaphoretic.  HENT:     Head: Normocephalic and atraumatic.  Eyes:     Extraocular Movements: EOM normal.     Conjunctiva/sclera: Conjunctivae normal.  Cardiovascular:     Rate and Rhythm: Normal rate and regular rhythm.     Pulses: Intact distal pulses.     Heart sounds: Normal heart sounds. No murmur heard. No friction rub. No gallop.   Pulmonary:     Effort: Pulmonary effort is normal. No respiratory distress.     Breath sounds: Wheezing present. No rales.  Abdominal:     General: There is no distension.     Palpations: Abdomen is soft.     Tenderness: There is no abdominal tenderness. There is no guarding.  Musculoskeletal:        General: No tenderness or edema.     Cervical back: Normal range of motion.  Skin:    General: Skin is warm and dry.     Findings: No erythema or rash.   Neurological:     Mental Status: She is alert and oriented to person, place, and time.     ED Results / Procedures / Treatments   Labs (all labs ordered are listed, but only abnormal results are displayed) Labs Reviewed  CBC WITH DIFFERENTIAL/PLATELET - Abnormal; Notable for the following components:      Result Value   Hemoglobin 15.2 (*)    All other components within normal limits  COMPREHENSIVE METABOLIC PANEL -  Abnormal; Notable for the following components:   Glucose, Bld 121 (*)    All other components within normal limits  RESP PANEL BY RT-PCR (FLU A&B, COVID) ARPGX2  BRAIN NATRIURETIC PEPTIDE  TROPONIN I (HIGH SENSITIVITY)    EKG EKG Interpretation  Date/Time:  Saturday August 23 2020 09:28:01 EST Ventricular Rate:  82 PR Interval:  144 QRS Duration: 82 QT Interval:  390 QTC Calculation: 455 R Axis:   67 Text Interpretation: Normal sinus rhythm T wave abnormality, consider inferior ischemia T wave abnormality, consider anterolateral ischemia Abnormal ECG Similar TW changes to prior Confirmed by Alvira Monday ([id number]) on 08/23/2020 12:01:47 PM   Radiology DG Chest Portable 1 View  Result Date: 08/23/2020 CLINICAL DATA:  Shortness of breath EXAM: PORTABLE CHEST 1 VIEW COMPARISON:  02/03/2020 FINDINGS: The heart size and mediastinal contours are within normal limits. Both lungs are clear. No pleural effusion. No pneumothorax. The visualized skeletal structures are unremarkable. IMPRESSION: No acute process in the chest. Electronically Signed   By: Guadlupe Spanish M.D.   On: 08/23/2020 10:16    Procedures Procedures (including critical care time)  Medications Ordered in ED Medications  acetaminophen (TYLENOL) tablet 1,000 mg (1,000 mg Oral Given 08/23/20 1156)    ED Course  I have reviewed the triage vital signs and the nursing notes.  Pertinent labs & imaging results that were available during my care of the patient were reviewed by me and considered in my  medical decision making (see chart for details).    MDM Rules/Calculators/A&P                          45yo female with history of COPD on 2L at night and with exertion, CHF, DM, PE on xarelto presents with concern for fatigue, cough, congestion, shortness of breath, headache.  Headache in setting of other symptoms, cough, congestion.  Minor trauma with dog hitting her 1 week ago, no nausea, vomiting or neurologic symptoms and doubt ICH.  Regarding cough, congestion, dyspnea.  CXR without pneumothorax, pneumonia, pulmonary edema. Doubt PE given on anticoagulation without missing doses.  COVID 19 testing negative. Troponin negative, BNP normal, no sign of ACS or CHF exacerbation. No anemia, no electrolyte abnormalities.  Wheezing noted on exam. Suspect viral URI with COPD exacerbation.  Wears O2 with sleep and longer ambulation and able to ambulate around ED without O2 with brief desaturation after ambulating distance.  Given rx for prednisone. Patient discharged in stable condition with understanding of reasons to return.    Final Clinical Impression(s) / ED Diagnoses Final diagnoses:  COPD exacerbation (HCC)  Other fatigue  Shortness of breath    Rx / DC Orders ED Discharge Orders         Ordered    predniSONE (DELTASONE) 10 MG tablet  Daily        08/23/20 1308           Alvira Monday, MD 08/23/20 2258

## 2020-08-23 NOTE — Discharge Instructions (Addendum)
It was a pleasure taking care of you today! We hope you feel better soon! Stay safe.

## 2020-10-25 IMAGING — CR CHEST - 2 VIEW
2 series · 2 of 2 positions shown · non-contrast
Comparison: Radiographs October 01, 2018.

CLINICAL DATA: Shortness of breath, cough.

EXAM:
CHEST - 2 VIEW

[w chest pa]
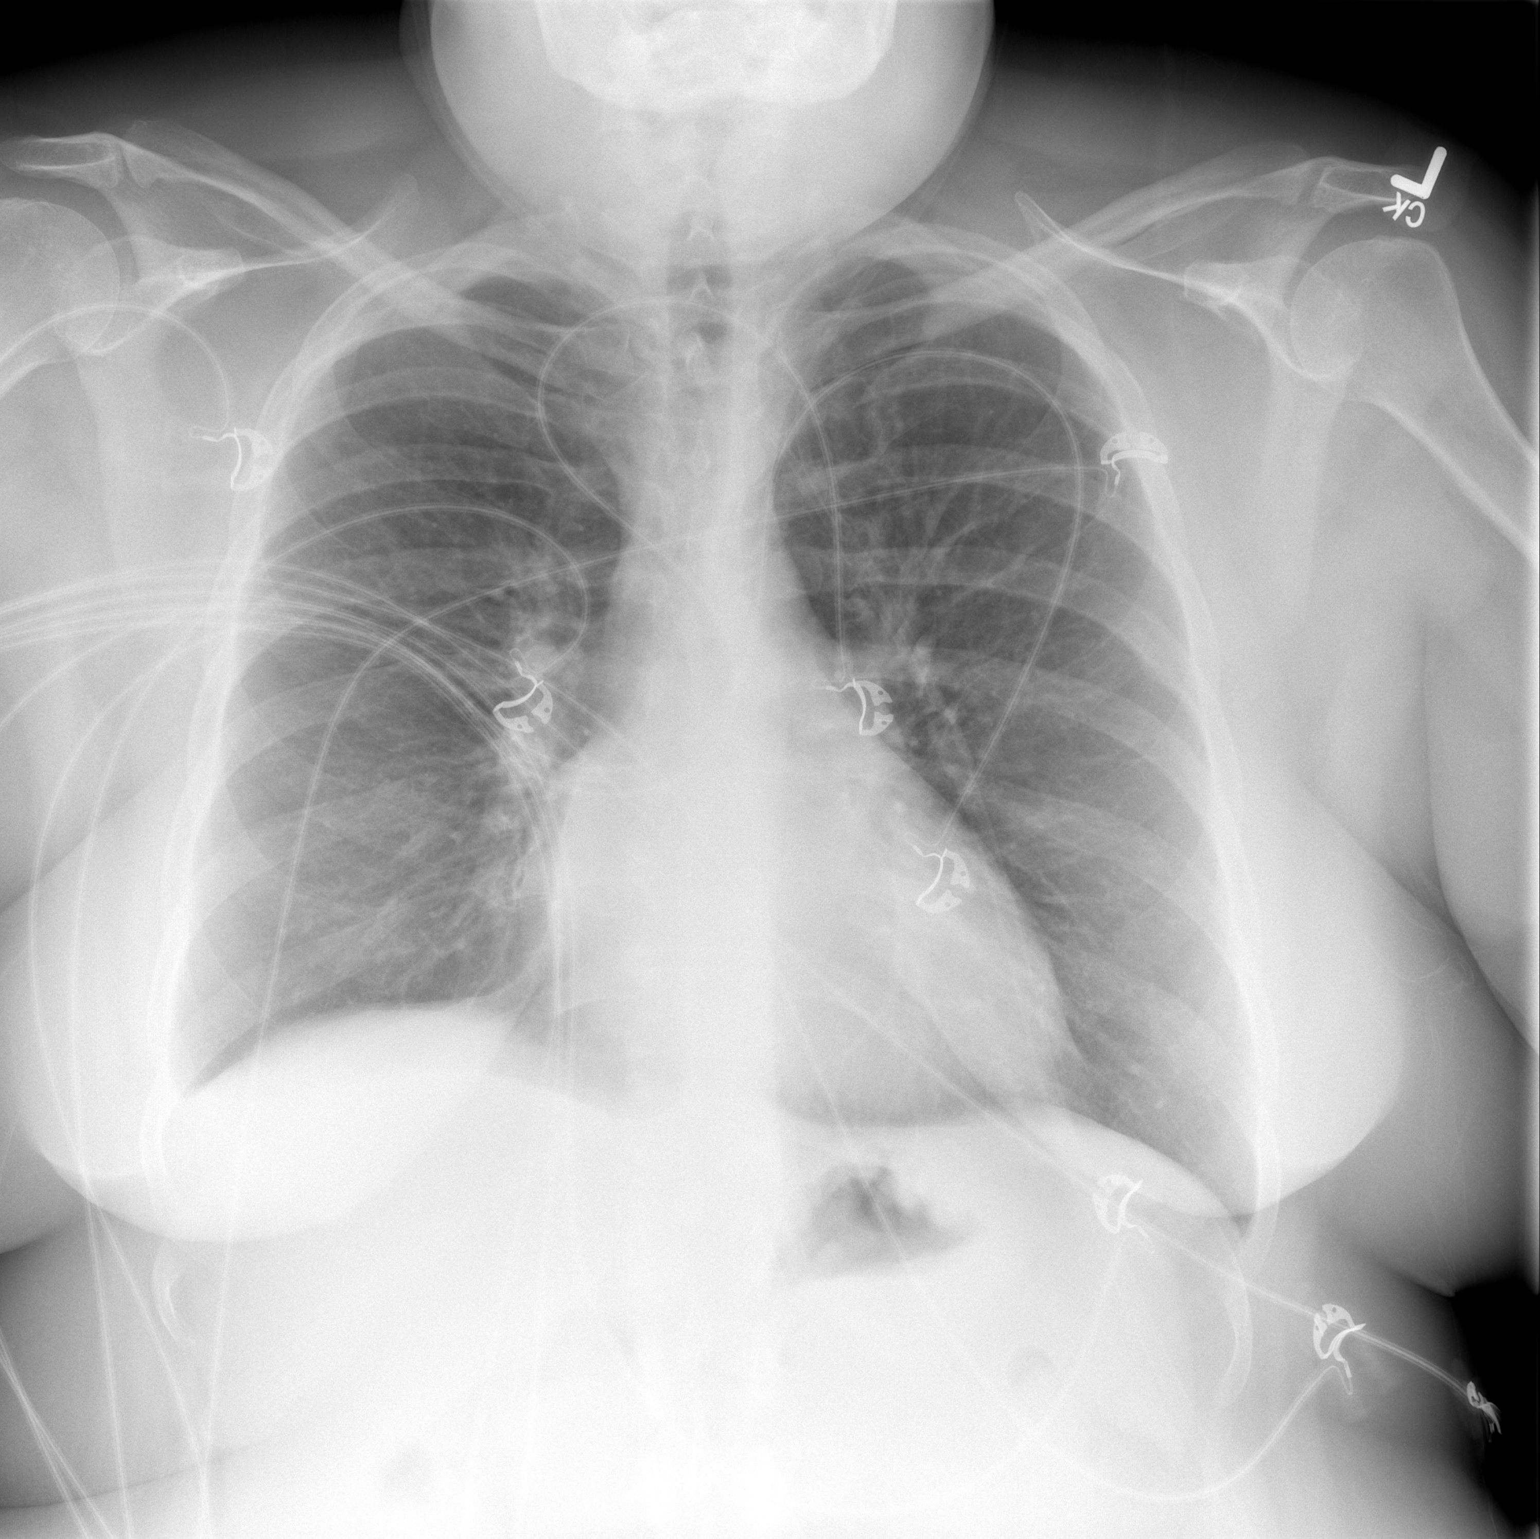

[w chest lat]
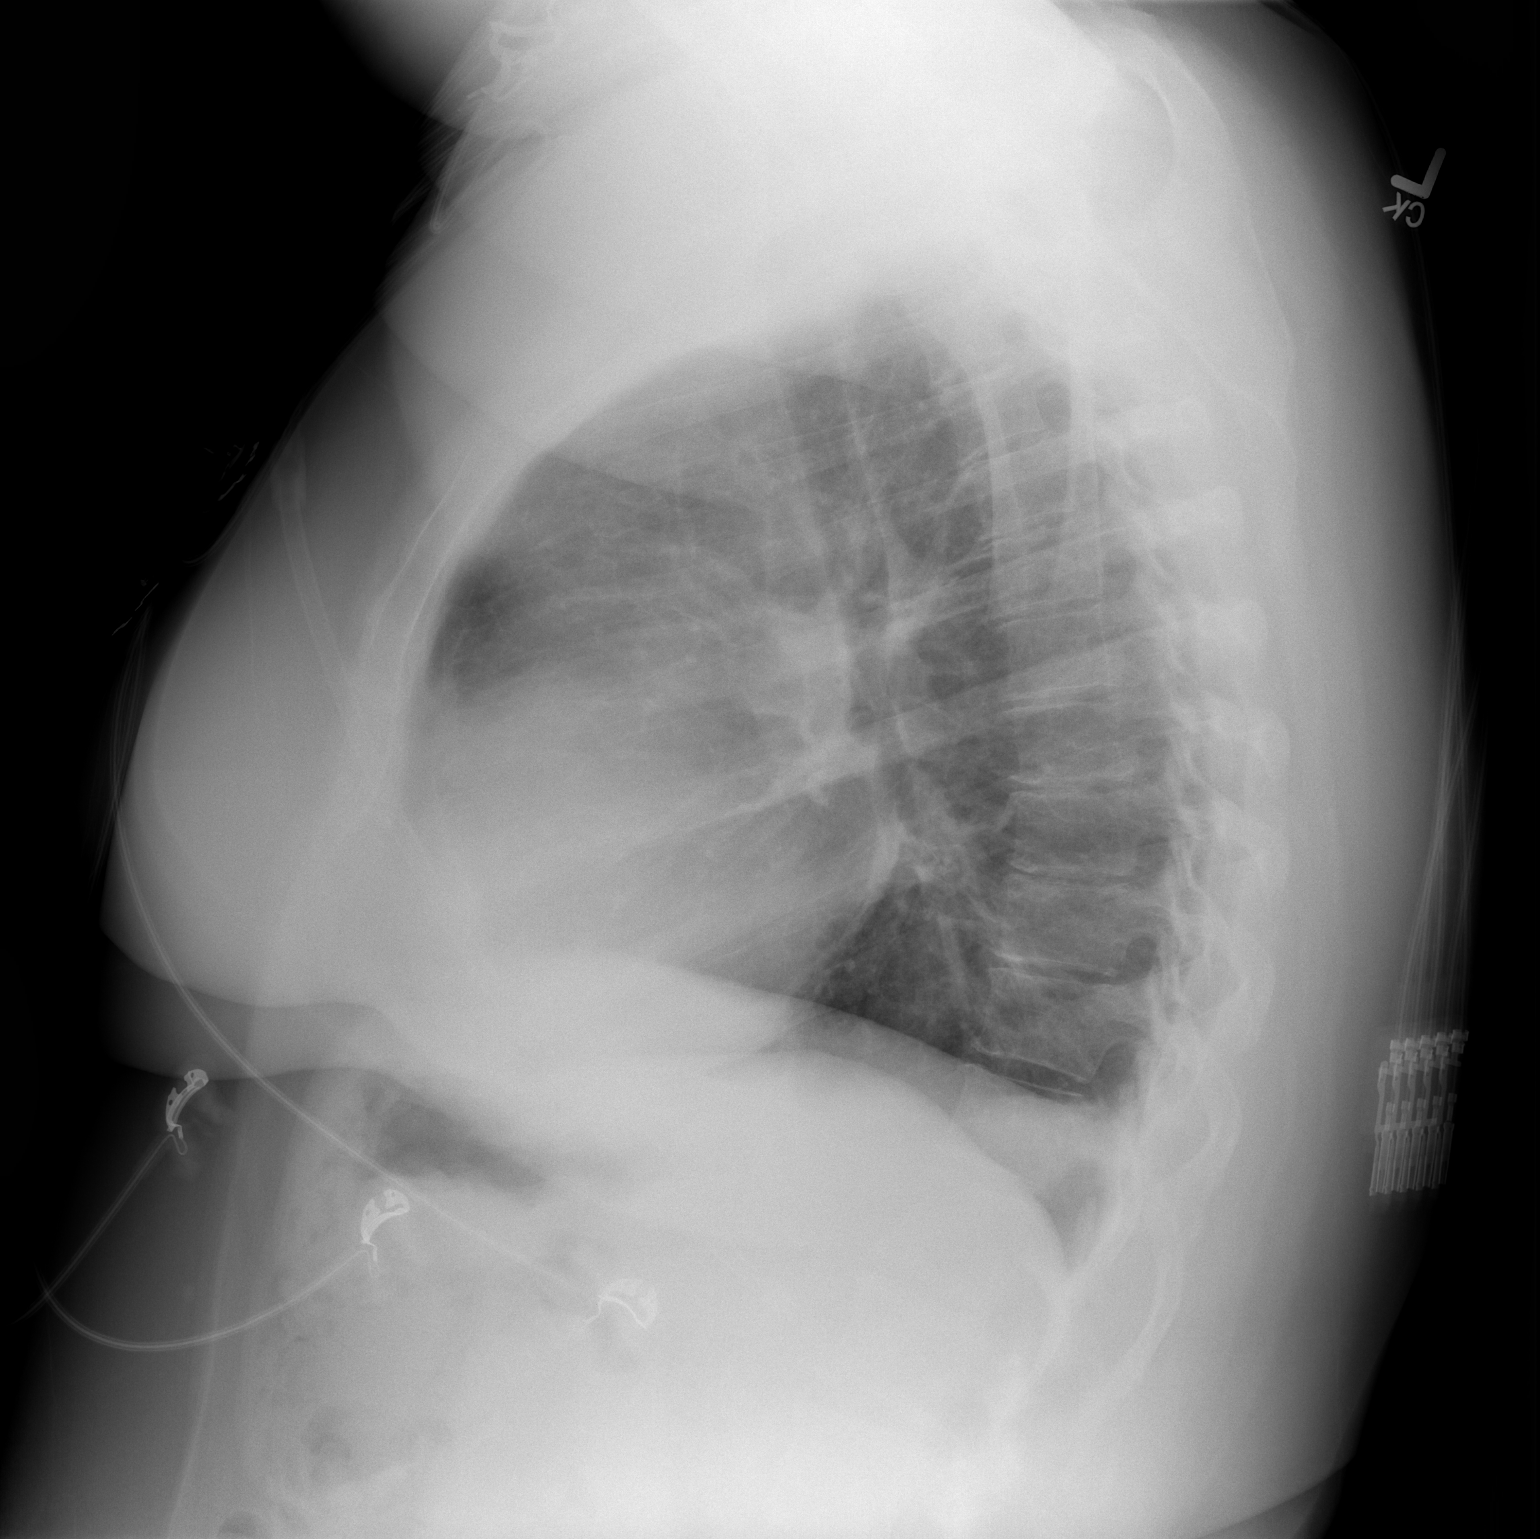

[2 of 2 positions shown; findings below may reference images not displayed]

FINDINGS: The heart size and mediastinal contours are within normal limits.
Both lungs are clear. No pneumothorax or pleural effusion is noted.
The visualized skeletal structures are unremarkable.
IMPRESSION: No active cardiopulmonary disease.

## 2022-08-23 IMAGING — DX DG CHEST 1V PORT
1 series · 1 of 1 positions shown · non-contrast
Comparison: 02/03/2020

CLINICAL DATA: Shortness of breath

EXAM:
PORTABLE CHEST 1 VIEW

[chest ap]
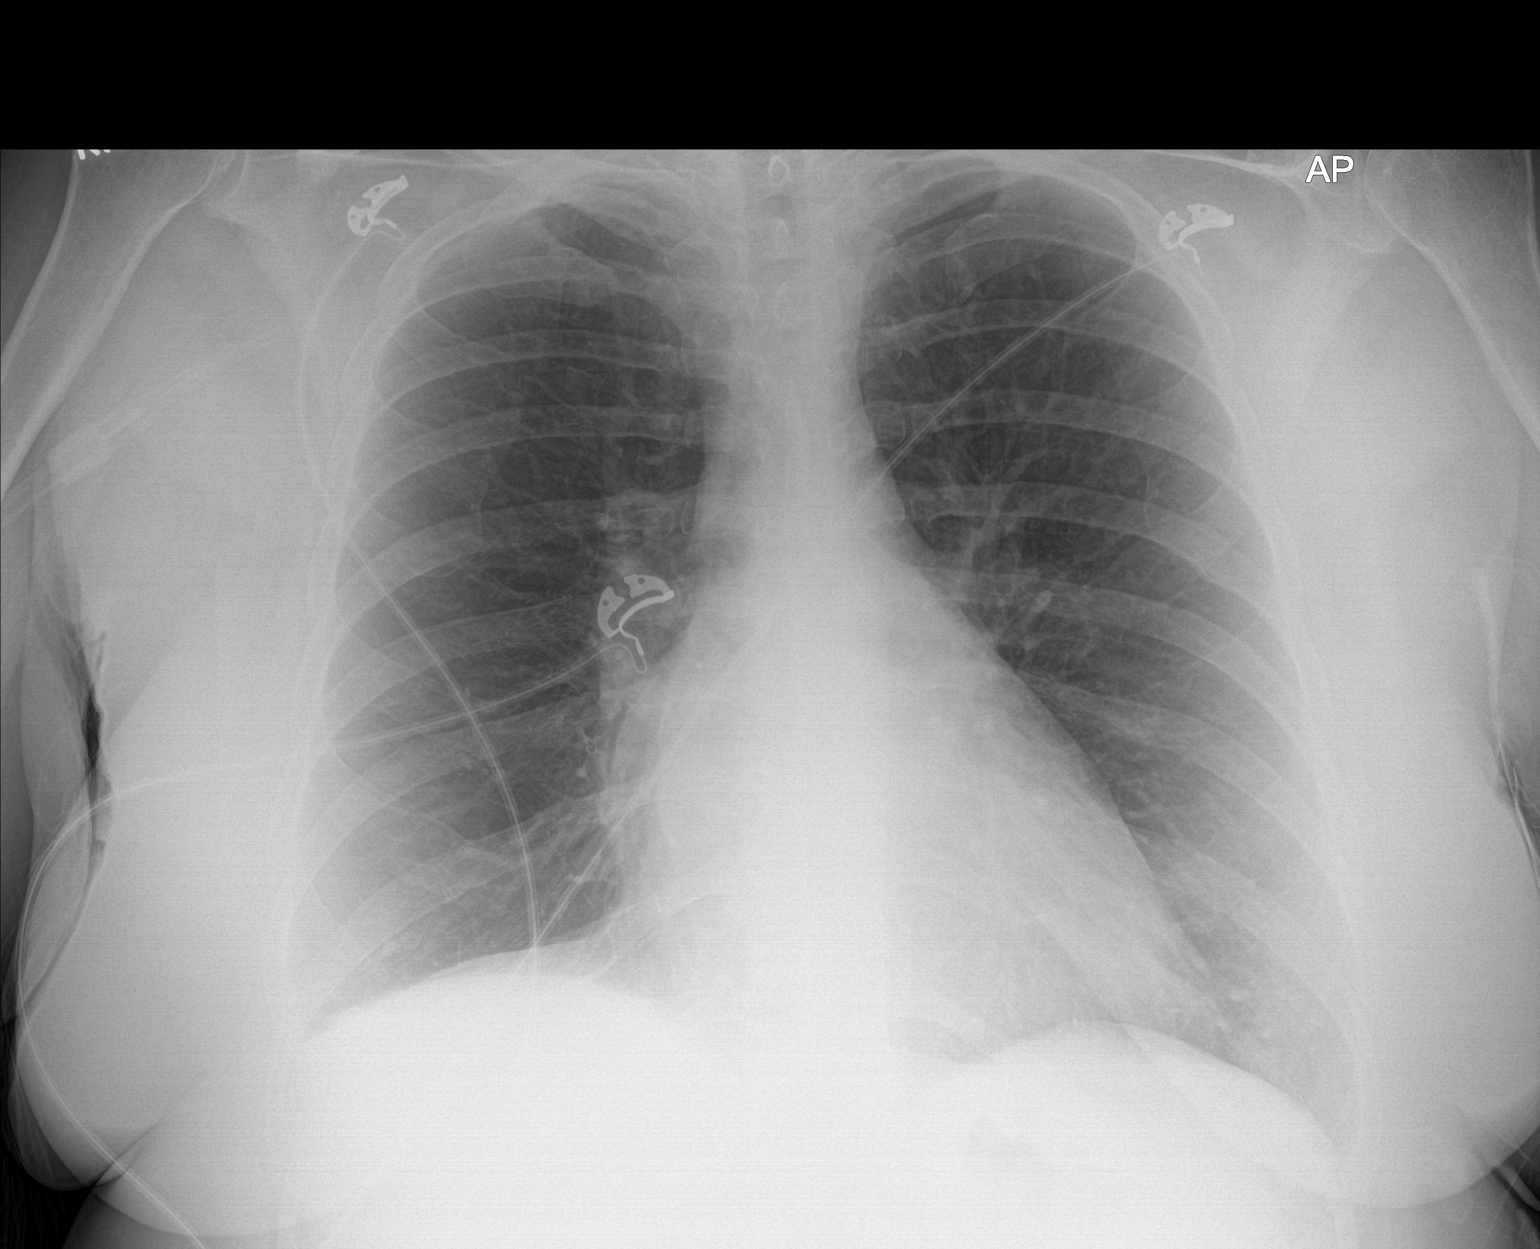

[1 of 1 positions shown; findings below may reference images not displayed]

FINDINGS: The heart size and mediastinal contours are within normal limits.
Both lungs are clear. No pleural effusion. No pneumothorax. The
visualized skeletal structures are unremarkable.
IMPRESSION: No acute process in the chest.

## 2023-11-09 ENCOUNTER — Other Ambulatory Visit: Payer: Self-pay

## 2023-11-09 ENCOUNTER — Emergency Department (HOSPITAL_BASED_OUTPATIENT_CLINIC_OR_DEPARTMENT_OTHER)
Admission: EM | Admit: 2023-11-09 | Discharge: 2023-11-09 | Disposition: A | Discharge status: Home / Self Care | Attending: Emergency Medicine | Admitting: Emergency Medicine

## 2023-11-09 ENCOUNTER — Emergency Department (HOSPITAL_BASED_OUTPATIENT_CLINIC_OR_DEPARTMENT_OTHER)

## 2023-11-09 ENCOUNTER — Encounter (HOSPITAL_BASED_OUTPATIENT_CLINIC_OR_DEPARTMENT_OTHER): Payer: Self-pay | Admitting: *Deleted

## 2023-11-09 DIAGNOSIS — D72829 Elevated white blood cell count, unspecified: Secondary | ICD-10-CM | POA: Diagnosis not present

## 2023-11-09 DIAGNOSIS — R1030 Lower abdominal pain, unspecified: Secondary | ICD-10-CM

## 2023-11-09 DIAGNOSIS — Z79899 Other long term (current) drug therapy: Secondary | ICD-10-CM | POA: Insufficient documentation

## 2023-11-09 DIAGNOSIS — K5904 Chronic idiopathic constipation: Secondary | ICD-10-CM | POA: Diagnosis not present

## 2023-11-09 HISTORY — DX: Unspecified atrial fibrillation: I48.91

## 2023-11-09 HISTORY — DX: Diverticulitis of intestine, part unspecified, without perforation or abscess without bleeding: K57.92

## 2023-11-09 LAB — URINALYSIS, MICROSCOPIC (REFLEX): RBC / HPF: NONE SEEN RBC/hpf (ref 0–5)

## 2023-11-09 LAB — CBC WITH DIFFERENTIAL/PLATELET
Abs Immature Granulocytes: 0.05 10*3/uL (ref 0.00–0.07)
Basophils Absolute: 0.1 10*3/uL (ref 0.0–0.1)
Basophils Relative: 0 %
Eosinophils Absolute: 0 10*3/uL (ref 0.0–0.5)
Eosinophils Relative: 0 %
HCT: 45 % (ref 36.0–46.0)
Hemoglobin: 15.7 g/dL — ABNORMAL HIGH (ref 12.0–15.0)
Immature Granulocytes: 0 %
Lymphocytes Relative: 26 %
Lymphs Abs: 3.4 10*3/uL (ref 0.7–4.0)
MCH: 32 pg (ref 26.0–34.0)
MCHC: 34.9 g/dL (ref 30.0–36.0)
MCV: 91.6 fL (ref 80.0–100.0)
Monocytes Absolute: 1 10*3/uL (ref 0.1–1.0)
Monocytes Relative: 8 %
Neutro Abs: 8.5 10*3/uL — ABNORMAL HIGH (ref 1.7–7.7)
Neutrophils Relative %: 66 %
Platelets: 277 10*3/uL (ref 150–400)
RBC: 4.91 MIL/uL (ref 3.87–5.11)
RDW: 12.5 % (ref 11.5–15.5)
WBC: 13 10*3/uL — ABNORMAL HIGH (ref 4.0–10.5)
nRBC: 0 % (ref 0.0–0.2)

## 2023-11-09 LAB — BASIC METABOLIC PANEL
Anion gap: 10 (ref 5–15)
BUN: 11 mg/dL (ref 6–20)
CO2: 28 mmol/L (ref 22–32)
Calcium: 9.6 mg/dL (ref 8.9–10.3)
Chloride: 98 mmol/L (ref 98–111)
Creatinine, Ser: 0.76 mg/dL (ref 0.44–1.00)
GFR, Estimated: >60 mL/min (ref >60)
Glucose, Bld: 90 mg/dL (ref 70–99)
Potassium: 4.7 mmol/L (ref 3.5–5.1)
Sodium: 136 mmol/L (ref 135–145)

## 2023-11-09 LAB — URINALYSIS, ROUTINE W REFLEX MICROSCOPIC
Bilirubin Urine: NEGATIVE
Glucose, UA: NEGATIVE mg/dL
Hgb urine dipstick: NEGATIVE
Ketones, ur: NEGATIVE mg/dL
Nitrite: NEGATIVE
Protein, ur: NEGATIVE mg/dL
Specific Gravity, Urine: 1.02 (ref 1.005–1.030)
pH: 6.5 (ref 5.0–8.0)

## 2023-11-09 MED ORDER — IOHEXOL 300 MG/ML  SOLN
100.0000 mL | Freq: Once | INTRAMUSCULAR | Status: AC | PRN
  Administered 2023-11-09: 100 mL via INTRAVENOUS

## 2023-11-09 MED ORDER — ACETAMINOPHEN 500 MG PO TABS
1000.0000 mg | ORAL_TABLET | Freq: Once | ORAL | Status: AC
  Administered 2023-11-09: 1000 mg via ORAL
  Filled 2023-11-09: qty 2

## 2023-11-09 MED ORDER — KETOROLAC TROMETHAMINE 30 MG/ML IJ SOLN
15.0000 mg | Freq: Once | INTRAMUSCULAR | Status: AC
  Administered 2023-11-09: 15 mg via INTRAVENOUS
  Filled 2023-11-09: qty 1

## 2023-11-09 NOTE — ED Notes (Signed)
 Patient transported to CT

## 2023-11-09 NOTE — ED Provider Notes (Signed)
 [facility] Provider Note   CSN: [id number] Arrival date & time: 11/09/23  0017     History {Add pertinent medical, surgical, social history, OB history to HPI:1} Chief Complaint  Patient presents with   Abdominal Pain    Sonya Pennington is a 48 y.o. female.  Presents to the emergency department for abdominal pain.  Patient reports that she initially thought she was constipated.  She does have a history of diverticulitis but usually gets diarrhea when she has flares of that.  She reports that she went off of her high-fiber diet 3 days ago.  She started having abdominal pain, today used MiraLAX twice and fruit juices but has not had a full bowel movement.  Pain has gotten worse.  Pain is mostly in the lower abdomen and pelvic area, radiates into the back.       Home Medications Prior to Admission medications   Medication Sig Start Date End Date Taking? Authorizing Provider  acetaminophen (TYLENOL) 500 MG tablet Take 1 tablet (500 mg total) by mouth every 6 (six) hours as needed. 11/30/15  Yes Westfall, Lanora Manis C, PA-C  albuterol (PROVENTIL HFA;VENTOLIN HFA) 108 (90 BASE) MCG/ACT inhaler Inhale 2 puffs into the lungs every 6 (six) hours as needed. For shortness of breath   Yes [provider]  albuterol (PROVENTIL) (2.5 MG/3ML) 0.083% nebulizer solution Take 3 mLs (2.5 mg total) by nebulization every 4 (four) hours as needed for wheezing or shortness of breath. 04/08/15  Yes Kihanna Kamiya, Canary Brim, MD  budesonide-formoterol (SYMBICORT) 160-4.5 MCG/ACT inhaler INHALE TWO PUFFS INTO THE LUNGS TWO TIMES DAILY FOR 30 DAYS 08/10/22  Yes [provider]  carbamazepine (TEGRETOL-XR) 100 MG 12 hr tablet Take 1 tablet (100 mg total) by mouth 2 (two) times daily. Patient taking differently: Take 200 mg by mouth 2 (two) times daily. 02/24/13  Yes Carleene Cooper, MD  levETIRAcetam (KEPPRA) 1000 MG tablet Take 2 tabs in AM, and 1.5 tabs  in PM. 06/11/22  Yes [provider]  lisinopril (PRINIVIL,ZESTRIL) 5 MG tablet Take 5 mg by mouth daily.   Yes [provider]  Rivaroxaban (XARELTO) 15 MG TABS tablet Take 15 mg by mouth daily.   Yes [provider]  spironolactone (ALDACTONE) 25 MG tablet Take 25 mg by mouth daily.   Yes [provider]  famotidine (PEPCID) 40 MG tablet Take 40 mg by mouth 2 (two) times daily.    [provider]  OVER THE COUNTER MEDICATION Take 30 mLs by mouth daily as needed. For cough/cold symptoms   Cough/cold/severe allergy liquid    [provider]  OZEMPIC, 0.25 OR 0.5 MG/DOSE, 2 MG/3ML SOPN Inject 0.5 mg into the skin once a week.    [provider]  Pantoprazole Sodium (PROTONIX PO) Take by mouth.    [provider]  TORSEMIDE PO Take by mouth.    [provider]      Allergies    Penicillins    Review of Systems   Review of Systems  Physical Exam Updated Vital Signs BP (!) 130/96 (BP Location: Left Arm)   Pulse (!) 110   Temp 98 F (36.7 C)   Resp 18   SpO2 95%  Physical Exam Vitals and nursing note reviewed.  Constitutional:      General: She is not in acute distress.    Appearance: She is well-developed.  HENT:     Head: Normocephalic and atraumatic.  Mouth/Throat:     Mouth: Mucous membranes are moist.  Eyes:     General: Vision grossly intact. Gaze aligned appropriately.     Extraocular Movements: Extraocular movements intact.     Conjunctiva/sclera: Conjunctivae normal.  Cardiovascular:     Rate and Rhythm: Normal rate and regular rhythm.     Pulses: Normal pulses.     Heart sounds: Normal heart sounds, S1 normal and S2 normal. No murmur heard.    No friction rub. No gallop.  Pulmonary:     Effort: Pulmonary effort is normal. No respiratory distress.     Breath sounds: Normal breath sounds.  Abdominal:     General: Bowel sounds are normal.     Palpations: Abdomen is soft.      Tenderness: There is abdominal tenderness in the right lower quadrant, suprapubic area and left lower quadrant. There is no guarding or rebound.     Hernia: No hernia is present.  Musculoskeletal:        General: No swelling.     Cervical back: Full passive range of motion without pain, normal range of motion and neck supple. No spinous process tenderness or muscular tenderness. Normal range of motion.     Right lower leg: No edema.     Left lower leg: No edema.  Skin:    General: Skin is warm and dry.     Capillary Refill: Capillary refill takes less than 2 seconds.     Findings: No ecchymosis, erythema, rash or wound.  Neurological:     General: No focal deficit present.     Mental Status: She is alert and oriented to person, place, and time.     GCS: GCS eye subscore is 4. GCS verbal subscore is 5. GCS motor subscore is 6.     Cranial Nerves: Cranial nerves 2-12 are intact.     Sensory: Sensation is intact.     Motor: Motor function is intact.     Coordination: Coordination is intact.  Psychiatric:        Attention and Perception: Attention normal.        Mood and Affect: Mood normal.        Speech: Speech normal.        Behavior: Behavior normal.     ED Results / Procedures / Treatments   Labs (all labs ordered are listed, but only abnormal results are displayed) Labs Reviewed - No data to display  EKG None  Radiology No results found.  Procedures Procedures  {Document cardiac monitor, telemetry assessment procedure when appropriate:1}  Medications Ordered in ED Medications - No data to display  ED Course/ Medical Decision Making/ A&P   {   Click here for ABCD2, HEART and other calculatorsREFRESH Note before signing :1}                              Medical Decision Making Amount and/or Complexity of Data Reviewed Labs: ordered. Radiology: ordered.  Risk OTC drugs. Prescription drug management.   Differential Diagnosis considered includes, but not  limited to: Appendicitis; colitis; diverticulitis; bowel obstruction; hernia; cystitis; nephrolithiasis; pyelonephritis.  Patient with known history of diverticulosis presents to the emergency department for evaluation of lower abdominal pain.  Patient reports that symptoms started 3 days ago.  She reports that she normally uses Metamucil daily and eats a high-fiber diet but for the last 3 days her diet has been poor and she has not used  her Metamucil.  She was feeling constipated, took MiraLAX twice today and prune juice but did not have much of a bowel movement.  With diffuse lower abdominal pain.  No signs of peritonitis.  Mild leukocytosis, lab work otherwise unremarkable.  Patient underwent CT scan to further evaluate.  Patient with some element of constipation but no active problems.  No evidence of colitis, diverticulitis.  Normal appendix.  {Document critical care time when appropriate:1} {Document review of labs and clinical decision tools ie heart score, Chads2Vasc2 etc:1}  {Document your independent review of radiology images, and any outside records:1} {Document your discussion with family members, caretakers, and with consultants:1} {Document social determinants of health affecting pt's care:1} {Document your decision making why or why not admission, treatments were needed:1} Final Clinical Impression(s) / ED Diagnoses Final diagnoses:  None    Rx / DC Orders ED Discharge Orders     None

## 2023-11-09 NOTE — ED Triage Notes (Signed)
 Pelvic pain for 3 nights, left sided pain up through the left lateral abdomen. No bowel movement for about 3 days, took Mirilax and prune juice without relief. Decreased appetite. Pain with urination. Denies fevers.

## 2023-11-09 NOTE — ED Notes (Signed)
 Pt here for pelvic pain that started 3 days ago Feels pressure from her back down towards rectum States she has been constipated (no BM in 3 days) Has tried Miralax with no relief

## 2023-11-09 NOTE — Discharge Instructions (Signed)
 Mix MiraLax per instructions on the bottle. Drink 8 ounces every 1-2 hours, up to 64 total ounces, or until you have bowel movement.
